# Patient Record
Sex: Female | Born: 1970 | Race: White | Hispanic: No | Smoking: Current every day smoker
Health system: Southern US, Community
[De-identification: ages and names within clinical notes are randomized; demographics above are authoritative.]

## PROBLEM LIST (undated history)

## (undated) DIAGNOSIS — M549 Dorsalgia, unspecified: Secondary | ICD-10-CM

## (undated) DIAGNOSIS — N809 Endometriosis, unspecified: Secondary | ICD-10-CM

## (undated) DIAGNOSIS — G4730 Sleep apnea, unspecified: Secondary | ICD-10-CM

## (undated) DIAGNOSIS — F4310 Post-traumatic stress disorder, unspecified: Secondary | ICD-10-CM

## (undated) DIAGNOSIS — F411 Generalized anxiety disorder: Secondary | ICD-10-CM

## (undated) DIAGNOSIS — C801 Malignant (primary) neoplasm, unspecified: Secondary | ICD-10-CM

## (undated) DIAGNOSIS — K5792 Diverticulitis of intestine, part unspecified, without perforation or abscess without bleeding: Secondary | ICD-10-CM

## (undated) DIAGNOSIS — K589 Irritable bowel syndrome without diarrhea: Secondary | ICD-10-CM

## (undated) HISTORY — DX: Irritable bowel syndrome, unspecified: K58.9

## (undated) HISTORY — PX: ABDOMINAL HYSTERECTOMY: SHX81

## (undated) HISTORY — DX: Diverticulitis of intestine, part unspecified, without perforation or abscess without bleeding: K57.92

## (undated) HISTORY — PX: WISDOM TOOTH EXTRACTION: SHX21

## (undated) HISTORY — PX: OVARIAN CYST REMOVAL: SHX89

## (undated) HISTORY — PX: BREAST LUMPECTOMY: SHX2

## (undated) HISTORY — DX: Generalized anxiety disorder: F41.1

## (undated) HISTORY — DX: Endometriosis, unspecified: N80.9

---

## 2002-05-16 ENCOUNTER — Inpatient Hospital Stay (HOSPITAL_COMMUNITY): Admission: AD | Admit: 2002-05-16 | Discharge: 2002-05-16 | Payer: Self-pay | Admitting: Gynecology

## 2002-05-17 ENCOUNTER — Encounter: Payer: Self-pay | Admitting: Gynecology

## 2002-05-17 ENCOUNTER — Inpatient Hospital Stay (HOSPITAL_COMMUNITY): Admission: AD | Admit: 2002-05-17 | Discharge: 2002-05-17 | Payer: Self-pay | Admitting: Gynecology

## 2002-05-18 ENCOUNTER — Inpatient Hospital Stay (HOSPITAL_COMMUNITY): Admission: AD | Admit: 2002-05-18 | Discharge: 2002-05-28 | Payer: Self-pay | Admitting: *Deleted

## 2002-05-18 ENCOUNTER — Encounter: Payer: Self-pay | Admitting: *Deleted

## 2002-05-19 ENCOUNTER — Encounter: Payer: Self-pay | Admitting: Gynecology

## 2002-05-24 ENCOUNTER — Encounter: Payer: Self-pay | Admitting: Gynecology

## 2002-05-28 ENCOUNTER — Encounter: Payer: Self-pay | Admitting: *Deleted

## 2002-05-28 ENCOUNTER — Encounter (INDEPENDENT_AMBULATORY_CARE_PROVIDER_SITE_OTHER): Payer: Self-pay | Admitting: Specialist

## 2004-08-07 ENCOUNTER — Ambulatory Visit: Payer: Self-pay | Admitting: Internal Medicine

## 2004-08-07 ENCOUNTER — Ambulatory Visit (HOSPITAL_BASED_OUTPATIENT_CLINIC_OR_DEPARTMENT_OTHER): Admission: RE | Admit: 2004-08-07 | Discharge: 2004-08-07 | Payer: Self-pay | Admitting: Family Medicine

## 2004-10-04 ENCOUNTER — Other Ambulatory Visit: Admission: RE | Admit: 2004-10-04 | Discharge: 2004-10-04 | Payer: Self-pay | Admitting: Obstetrics and Gynecology

## 2004-11-23 ENCOUNTER — Emergency Department (HOSPITAL_COMMUNITY): Admission: EM | Admit: 2004-11-23 | Discharge: 2004-11-24 | Payer: Self-pay | Admitting: Emergency Medicine

## 2004-12-05 ENCOUNTER — Emergency Department (HOSPITAL_COMMUNITY): Admission: EM | Admit: 2004-12-05 | Discharge: 2004-12-05 | Payer: Self-pay | Admitting: Emergency Medicine

## 2005-01-18 ENCOUNTER — Encounter: Admission: RE | Admit: 2005-01-18 | Discharge: 2005-01-18 | Payer: Self-pay | Admitting: Neurology

## 2005-07-09 ENCOUNTER — Other Ambulatory Visit: Admission: RE | Admit: 2005-07-09 | Discharge: 2005-07-09 | Payer: Self-pay | Admitting: Obstetrics and Gynecology

## 2007-07-31 DIAGNOSIS — N809 Endometriosis, unspecified: Secondary | ICD-10-CM

## 2007-07-31 HISTORY — DX: Endometriosis, unspecified: N80.9

## 2007-12-08 ENCOUNTER — Ambulatory Visit (HOSPITAL_COMMUNITY): Admission: RE | Admit: 2007-12-08 | Discharge: 2007-12-08 | Payer: Self-pay | Admitting: Family Medicine

## 2008-03-10 ENCOUNTER — Ambulatory Visit (HOSPITAL_COMMUNITY): Admission: RE | Admit: 2008-03-10 | Discharge: 2008-03-10 | Payer: Self-pay | Admitting: Family Medicine

## 2008-07-06 ENCOUNTER — Ambulatory Visit (HOSPITAL_COMMUNITY): Admission: RE | Admit: 2008-07-06 | Discharge: 2008-07-06 | Payer: Self-pay | Admitting: Family Medicine

## 2008-07-16 ENCOUNTER — Ambulatory Visit (HOSPITAL_COMMUNITY): Admission: RE | Admit: 2008-07-16 | Discharge: 2008-07-16 | Payer: Self-pay | Admitting: Family Medicine

## 2008-11-29 ENCOUNTER — Ambulatory Visit (HOSPITAL_COMMUNITY): Admission: RE | Admit: 2008-11-29 | Discharge: 2008-11-29 | Payer: Self-pay | Admitting: Family Medicine

## 2009-06-30 ENCOUNTER — Ambulatory Visit (HOSPITAL_COMMUNITY): Payer: Self-pay | Admitting: Psychiatry

## 2009-07-12 ENCOUNTER — Ambulatory Visit (HOSPITAL_COMMUNITY): Payer: Self-pay | Admitting: Psychiatry

## 2010-02-06 ENCOUNTER — Ambulatory Visit: Payer: Self-pay | Admitting: Oncology

## 2010-02-08 ENCOUNTER — Encounter: Admission: RE | Admit: 2010-02-08 | Discharge: 2010-02-08 | Payer: Self-pay | Admitting: Radiology

## 2010-02-08 LAB — COMPREHENSIVE METABOLIC PANEL
ALT: 19 U/L (ref 0–35)
Albumin: 4.1 g/dL (ref 3.5–5.2)
CO2: 29 mEq/L (ref 19–32)
Calcium: 9.2 mg/dL (ref 8.4–10.5)
Chloride: 105 mEq/L (ref 96–112)
Potassium: 3.7 mEq/L (ref 3.5–5.3)
Sodium: 139 mEq/L (ref 135–145)
Total Protein: 7.4 g/dL (ref 6.0–8.3)

## 2010-02-08 LAB — CBC WITH DIFFERENTIAL/PLATELET
HCT: 40.1 % (ref 34.8–46.6)
HGB: 13.8 g/dL (ref 11.6–15.9)
LYMPH%: 27.5 % (ref 14.0–49.7)
MCHC: 34.4 g/dL (ref 31.5–36.0)
MCV: 94.1 fL (ref 79.5–101.0)
MONO#: 0.6 10*3/uL (ref 0.1–0.9)
MONO%: 6.3 % (ref 0.0–14.0)
NEUT#: 6.5 10*3/uL (ref 1.5–6.5)
NEUT%: 64.1 % (ref 38.4–76.8)
Platelets: 240 10*3/uL (ref 145–400)

## 2010-02-08 LAB — CANCER ANTIGEN 27.29: CA 27.29: 17 U/mL (ref 0–39)

## 2010-02-15 ENCOUNTER — Ambulatory Visit (HOSPITAL_COMMUNITY): Admission: RE | Admit: 2010-02-15 | Discharge: 2010-02-15 | Payer: Self-pay | Admitting: Oncology

## 2010-02-15 ENCOUNTER — Encounter: Payer: Self-pay | Admitting: Oncology

## 2010-02-15 ENCOUNTER — Ambulatory Visit: Admission: RE | Admit: 2010-02-15 | Discharge: 2010-02-15 | Payer: Self-pay | Admitting: Oncology

## 2010-02-15 ENCOUNTER — Ambulatory Visit: Payer: Self-pay | Admitting: Cardiology

## 2010-02-21 ENCOUNTER — Encounter: Admission: RE | Admit: 2010-02-21 | Discharge: 2010-02-21 | Payer: Self-pay | Admitting: General Surgery

## 2010-02-22 ENCOUNTER — Ambulatory Visit: Payer: Self-pay | Admitting: Genetic Counselor

## 2010-04-25 ENCOUNTER — Ambulatory Visit: Payer: Self-pay | Admitting: Genetic Counselor

## 2010-06-09 ENCOUNTER — Ambulatory Visit: Payer: Self-pay | Admitting: Cardiology

## 2010-06-11 ENCOUNTER — Ambulatory Visit: Payer: Self-pay | Admitting: Cardiology

## 2010-07-13 ENCOUNTER — Ambulatory Visit: Payer: Self-pay | Admitting: Physician Assistant

## 2010-08-20 ENCOUNTER — Encounter: Payer: Self-pay | Admitting: Neurology

## 2010-08-20 ENCOUNTER — Encounter: Payer: Self-pay | Admitting: Obstetrics and Gynecology

## 2010-08-20 ENCOUNTER — Encounter: Payer: Self-pay | Admitting: General Surgery

## 2010-12-15 NOTE — Procedures (Signed)
NAMEJONITA, HIROTA                 ACCOUNT NO.:  [id number]   MEDICAL RECORD NO.:  [id number]          PATIENT TYPE:  OUT   LOCATION:  RAD                           FACILITY:  [facility]   PHYSICIAN:  Edward L. Juanetta Gosling, M.D.DATE OF BIRTH:  1970/11/28   DATE OF PROCEDURE:  DATE OF DISCHARGE:  12/08/2007                            PULMONARY FUNCTION TEST   1. Spirometry is normal.  2. Lung volumes are normal.  3. There is some mild airflow obstruction seen at the level of the      smaller airways consistent with smoking history.  There is no      significant bronchodilator response.      Edward L. Juanetta Gosling, M.D.  Electronically Signed     ELH/MEDQ  D:  12/10/2007  T:  12/10/2007  Job:  [id number]   cc:   Donna Bernard, M.D.  Fax: [phone]

## 2010-12-15 NOTE — Discharge Summary (Signed)
NAME:  Lori Underwood, Lori Underwood                           ACCOUNT NO.:  [id number]   MEDICAL RECORD NO.:  [id number]                   PATIENT TYPE:  INP   LOCATION:  9324                                 FACILITY:  [facility]   PHYSICIAN:  Ivor Costa. Farrel Gobble, M.D.              DATE OF BIRTH:  07/01/1971   DATE OF ADMISSION:  05/18/2002  DATE OF DISCHARGE:  05/28/2002                                 DISCHARGE SUMMARY   DISCHARGE DIAGNOSES:  1. Intrauterine pregnancy 17+ weeks.  2. Premature rupture of membranes.  3. Status post induction for preterm premature rupture of membranes at     approximately 18 weeks.  4. Anemia.   HISTORY OF PRESENT ILLNESS:  The patient is a 40 year old female gravida 2,  para 1.  Prenatal course has been complicated by having vaginal infection.  She was treated with Flagyl twice a day for 10 days.  She had premature  rupture of membranes with decreased amniotic fluid prior to this admission  and was released from the hospital in the p.m. of May 17, 2002.   HOSPITAL COURSE:  On May 18, 2002, the patient represented after being  released from the hospital with conservative management __________ 500 mg  b.i.d., with a large gush of fluid.  Repeat ultrasound revealed AFI to be  2.9, pocket less than 5%, cervix 3.5.  Speculum examination revealed no  pooling, moist vaginal mucosa, leaked amniotic fluid upon Valsalva maneuver.  The patient was admitted for IV hydration and Unasyn 3.0 g IV q.6h. and  follow-up ultrasound and CBC.  The patient was continued on conservative  measures, was thoroughly counseled and aware of fetal deformation syndrome.  On May 19, 2002, amniotic fluid continued to be decreased, leaking still  small amount of amniotic fluid, continued IV antibiotics. On May 20, 2002, there was noted to be no noticed fluid on ultrasound.  The patient has  been instructed to wait to follow-up ultrasound schedule.  If continued to  decrease AFI, proceed  with induction.  On May 21, 2002, the patient  continued status quo as well as on May 22, 2002.  On May 23, 2002,  the patient was asymptomatic, afebrile.  An ultrasound was performed on  May 24, 2002, which showed AFI of 0.68, essentially no change.  The  patient and her husband were counseled and desired induction of labor.  Cytotec was placed.  On May 25, 2002, increased 100 mg intravaginal  q.6h.  Epidural was on board.  Foley catheter was on board.  Laminaria were  replaced in the p.m. of May 25, 2002, secondary to no cervical change.  It was removed on the a.m. of May 26, 2002.  Cervix at that point was 2  cm, 80% effaced, bloody show.  Cytotec tablet was placed on the cervix.  It  was noted that there was some cramping.  Cervix exam at 2200  revealed cervix  was 2 cm, large clot which was removed, Cytotec placed.  May 27, 2002,  the patient was continued on Cytotec, complained of increased cramps at 1300  hours.  Blood clots and fluid was in the introitus.  Cervical check was  still 2 cm.  On May 27, 2002, the patient felt significantly more  cramping and subsequently the placenta was noted in the vagina.  Uterus  massaged and placenta and fetus expelled intact.  There were no  complications.  The patient had hemoglobin on May 28, 2002, of 6.6.  It  was felt that this was probably secondary to an abruption since there had  been copious clotting prior to delivery.  The patient was asymptomatic and  she was felt stable for discharge.  She was to follow up in the office in  two weeks.  She was given prescription for Xanax p.r.n. She was to resume  her Wellbutrin one p.o. daily and then increase to b.i.d. after one week.    Susa Loffler, P.A.                    Ivor Costa. Farrel Gobble, M.D.     TSG/MEDQ  D:  07/31/2002  T:  07/31/2002  Job:  [id number]

## 2010-12-15 NOTE — Consult Note (Signed)
   NAME:  Lori Underwood, Lori Underwood                           ACCOUNT NO.:  [id number]   MEDICAL RECORD NO.:  [id number]                   PATIENT TYPE:  INP   LOCATION:  9181                                 FACILITY:  [facility]   PHYSICIAN:  Ivor Costa. Farrel Gobble, M.D.              DATE OF BIRTH:  04/07/1971   DATE OF CONSULTATION:  DATE OF DISCHARGE:                                   CONSULTATION   ADDENDUM:  We also discussed the risk of induction of labor being that she  has a low transverse uterine scar and the use of Cytotec while it has been  associated with increased risk for scar rupture it is generally seen in  third trimester, although the patient is aware of this increased risk of  scar rupture and the complications that that would entail.                                               Ivor Costa. Farrel Gobble, M.D.    THL/MEDQ  D:  05/24/2002  T:  05/25/2002  Job:  [id number]

## 2010-12-15 NOTE — Consult Note (Signed)
NAME:  Lori Underwood, Lori Underwood                           ACCOUNT NO.:  [id number]   MEDICAL RECORD NO.:  [id number]                   PATIENT TYPE:  INP   LOCATION:  9134                                 FACILITY:  [facility]   PHYSICIAN:  Timothy P. Fontaine, M.D.           DATE OF BIRTH:  September 23, 1970   DATE OF CONSULTATION:  05/19/2002  DATE OF DISCHARGE:                                   CONSULTATION   HISTORY OF PRESENT ILLNESS:  In to see the patient and her husband.  Results  of ultrasound from today show essentially a 0 AFI.  They had a 5.5 cm AFI  the day prior.  Reviewed situation with the patient and her husband as has  Dr. Lily Peer and Dr. Penni Homans previously.  I reviewed with them the  various issues to include the maternal versus the fetal issues.  I have  discussed risks of expectant management as far as the mother is concerned to  include the risks of infection and sepsis.  I reviewed the fetal issues as  far as short term and long term.  I reviewed the issues of pulmonary  hypoplasia as well as structural deformities associated with prolonged  oligohydramnios.  I also discussed the issues of achieving a periviable  gestation such as 23-24 weeks, at which time subsequent birth would have the  potential for long term devastating sequelae, both neurologic as well as  physical.  The options for termination now as well as continued expectant  management were reviewed, and the options of termination to include D&E  versus induction were discussed with them.  I also discussed with her her  prior history of cesarean section and the methods of induction to include  Cervidil versus Cytotec type induction and the potential for complications  associated with delivery, both from the D&E to include perforation, internal  organ damage versus induction with uterine rupture, particularly associated  with Cytotec, although I reviewed with her that at this early of gestation  that this more than likely  will not be an issue, but she would accept the  risks if indeed we proceed with this route.  The patient has no gross  history of any leakage over today, although her AFI is lower than yesterday.  She does have a clear history of leakage over this past weekend consistent  with rupture of membranes leading to her diagnosis of oligohydramnios.  I  discussed other possibilities to include nonrupture oligohydramnios and the  ominous prognosis associated with this to include major chromosomal  abnormalities, although at this point everything points towards a rupture of  membranes as the etiology of her oligohydramnios.  She has no overt history  as to etiologies such as falls, injuries, strenuous activities.  The patient  and her husband are going to discuss tonight their desires.  I did ask them  that we need to set a definitive time frame as  far as to proceed with  termination if they ultimately decide if that is appropriate and as to when  we should reultrasound for amniotic fluid accumulation.  I did ask them not  to postpone their decision long term such as 20 weeks, but over the next  several days to a week.  It would be more prudent to make the decision and  proceed with delivery if indeed oligohydramnios continues.  Again  alternatives for continued expectant management were discussed, although she  certainly will have to accept the potential risks to include both maternal  and fetal as discussed with her.  The patient and her husband are going to  further discuss this evening and will follow up review again in the a.m.                                                  Timothy P. Audie Box, M.D.    TPF/MEDQ  D:  05/19/2002  T:  05/19/2002  Job:  [id number]

## 2010-12-15 NOTE — Consult Note (Signed)
NAME:  Lori Underwood, Lori Underwood                           ACCOUNT NO.:  [id number]   MEDICAL RECORD NO.:  [id number]                   PATIENT TYPE:  INP   LOCATION:  9181                                 FACILITY:  [facility]   PHYSICIAN:  Ivor Costa. Farrel Gobble, M.D.              DATE OF BIRTH:  07-25-1971   DATE OF CONSULTATION:  05/24/2002  DATE OF DISCHARGE:                                   CONSULTATION   REASON FOR CONSULTATION:  This is a followup visit from earlier today.  Basically, the patient is 17 weeks and 6/7 with PPROM x8 days hospitalized  now for one week.  The patient had a follow-up ultrasound done today to see  if she has reaccumulated any fluid.  Unfortunately, she had never reported  any leakage of fluid until this morning, only a scant amount, and the  ultrasound confirmed as suspected that there was no reaccumulation of  amniotic fluid.  The AFI was 0.68 cm.  I again discussed the findings with  the patient and her husband.  She does report that she is having some mild  cramping and she did have a small amount of pink discharge earlier, but none  currently, and on examination her uterine fundus is slightly tender,  although she remains afebrile.  Certainly, the risks were as outlined  previously including severe malformations of the infant some of which may  not be compatible with life such as hypoplastic lungs, abnormalities of the  bowel and colon, limb abnormalities, facial abnormalities also related to  anhydramnios.  Concerns with regards to the patient are her fundal  tenderness, risks for uterine scarring secondary to an overwhelming  infection, certainly risks of sepsis to the patient.   RECOMMENDATIONS:  After discussion with the patient she does agree to go  ahead and have an induction of labor and we will start that this evening.  Of note, with review of the ultrasound, the patient does have a low anterior  placenta.  She does have a history of a previous cesarean  section.  So in  addition to consenting her to a postpartum D&C for retained placenta, a D&E  for failed successful delivery vaginally in the event that the patient  begins to hemorrhage or become unstable.  Exploratory laparotomy and total  abdominal hysterectomy or adding the likelihood of needing to do either of  the above is low, albeit the patient was consented and agreeable.  Risks  from the prolonged rupture include an Asherman syndrome and a postpartum  endometritis and certainly increase the risk for her not being successful in  vaginally delivering the placenta.  We also had a long discussion with  regard to care of the fetus.  They are agreeable to doing fetal chromosome  studies which we will do after delivery.  In addition, we will do placental  chromosomes for both fetal and maternal sides.  They  are aware that perhaps  they may have no growth secondary to the IV antibiotics that she has been  on.  In addition, they have consented to an autopsy.  Of note, the patient  has not had an MSAFP or screening ultrasound for anomalies done at this  point in the pregnancy.  Risks of bleeding, infection, damage to the  bladder, risks of blood transfusion were discussed.  Risks of sepsis were  discussed.  Risks of infection as a result of transfusion was discussed.  All questions were addressed.  Total length of consultation was 60 minutes.                                               Ivor Costa. Farrel Gobble, M.D.    THL/MEDQ  D:  05/24/2002  T:  05/25/2002  Job:  [id number]

## 2010-12-15 NOTE — Procedures (Signed)
NAMEMASAKO, OVERALL                 ACCOUNT NO.:  [id number]   MEDICAL RECORD NO.:  [id number]          PATIENT TYPE:  OUT   LOCATION:  SLEEP CENTER                 FACILITY:  [facility]   PHYSICIAN:  Clinton D. Maple Hudson, M.D. DATE OF BIRTH:  05/10/1971   DATE OF STUDY:  08/07/2004                              NOCTURNAL POLYSOMNOGRAM   INDICATIONS FOR STUDY:  Hypersomnia with sleep apnea. Epworth sleepiness  score 8/24, BMI 26.5, weight 150 pounds.   SLEEP ARCHITECTURE:  Total sleep time 355 minutes with sleep efficiency of  86%. Stage I was 5%, stage II was 55%, stages III and IV were 11%, REM was  29% of total sleep time. Latency to sleep onset 23.5 minutes. Latency to REM  98 minutes. Awake after sleep onset 35 minutes. Arousal index 30.9, which is  increased.   RESPIRATORY DATA:  RDI 10.6 obstructive events per hour indicating mild  obstructive sleep apnea/hypopnea syndrome. This reflected 63 hypopneas. The  events were not positional. REM RDI 7.6 per hour. There were insufficient  events to trigger use of the CPAP titration protocol on this study.   OXYGEN DATA:  Moderate to loud snoring with oxygen desaturation to a nadir  of 76% noted briefly. Typical desaturation was to 94% and mean oxygen  saturation to the study was 96% to 97% on room air.   CARDIAC DATA:  Normal sinus rhythm.   MOVEMENT/PARASOMNIA:  A total of limb jerks were reported of which 25 were  associated with arousal or awakening for a periodic limb movement with  arousal index of 4.2 per hour, which is mildly increased.   IMPRESSION/RECOMMENDATIONS:  1.  Mild obstructive sleep apnea/hypopnea syndrome with normal oxygenation,      RDI 10.6 per hour. If conservative measures are insufficient a return      for CPAP titration may be appropriate.  2.  Mild periodic limb movement with arousal, 4.2 per hour. This may justify      treatment if it persists.  It is bothersome after treatment for sleep      apnea.                                                      Clinton D. Maple Hudson, M.D.  Diplomate, [employer]   CDY/MEDQ  D:  08/13/2004 09:48:43  T:  08/13/2004 11:07:47  Job:  [id number]

## 2010-12-15 NOTE — Consult Note (Signed)
NAME:  Lori Underwood, Lori Underwood                           ACCOUNT NO.:  [id number]   MEDICAL RECORD NO.:  [id number]                   PATIENT TYPE:  MAT   LOCATION:  MATC                                 FACILITY:  [facility]   PHYSICIAN:  Juan H. Lily Peer, M.D.             DATE OF BIRTH:  1971/03/18   DATE OF CONSULTATION:  DATE OF DISCHARGE:                                   CONSULTATION   HISTORY OF PRESENT ILLNESS:  The patient is a 40 year old gravida 2, para 1,  with a last menstrual period of January 20, 2002.  Estimated date of  confinement of October 28, 2002.  The patient currently is 7-1/[redacted] weeks  gestation and presented to [facility] today complaining of gush of  fluid per vagina, trickling early in the day and then went to the movies,  returned from the movies, and had a big gush at time she went to the  bathroom. She had been seen at [facility] the day before complaining  of a pinkish discharge she had had for a couple of days and a wet prep had  demonstrated large quantities of bacteria and white blood cells.  A  catheterization UA was done and was unremarkable and she was sent home on  MetroGel vaginal cream. Her cervix was long and closed and was afebrile.  Upon her return today, a sterile speculum examination had been performed.  The cervix was long and closed. There was no gross vaginal pooling, but the  vaginal wall was moist, positive nitrazine. A fern test was done and the  slide demonstrated evidence of ferning.  An ultrasound was also performed to  quantitate the amniotic fluid volume and the ultrasound was reported to be  normal for [redacted] weeks gestation and the ultrasound consisted with her dates.  She also had a wet prep that demonstrated today some yeast and many WBCs.  White blood cell count was 11.5, hemoglobin and hematocrit were 11.5 and  34.1 respectively with a platelet count of 254,000. Her abdomen was soft and  nontender, there was no CVA tenderness and no  digital pelvic examination was  done.   PHYSICAL EXAMINATION:  VITAL SIGNS:  Temperature initially was 100.3 and on  repeat was 98.7, blood pressure was 131/68.  GENERAL:  The patient was in no acute distress.   We went through a lengthy discussion of preterm premature rupture of  membranes to include the following.  We discussed the fetal deformation  syndrome from preterm rupture of membranes could result in acute and growth  restriction.  1) Intrauterine growth retardation.  2) Compression  malformation of face, limbs, and extremities.  3) Pulmonary hyperplasia.  We  had done GC and Chlamydia culture and GBS culture which all are pending at  this time. We discussed potential causes for her premature rupture of  membranes and one was the possibility of underlying infection, urinary  leakage but this was discarded due to the fact that positive nitrazine and  ferning were evident, or the possibility of an incompetent cervix.  The  cervix although transabdominal by ultrasound was documented at 3.2 cm. The  patient's previous pregnancy was a result of a cesarean section at term.  We  discussed low probability such as (25 to 40%) that a viable gestational age  would be achieved and that the patient would deliver a surviving infant.  Nevertheless, this is an early gestation and this could potentially be a  small tear or leak and hopefully it is seal and tissue will continue to  produce amniotic fluid to 36 to 37 weeks of gestation, that conservative  management could be undertaken with close follow-up in the office.  The risk  of maternal infection could potentially lead to abruption from conservative  management as well as high rate of fetal neurological morbidity.  The  patient also had opted to proceed with option A which would be expectant and  with instructions given for bed rest, no intercourse, no lifting or  strenuous activity, increasing her fluid intake to 8 to 10 glasses of  fluid  a day, and monitor daily temperatures at home.  She has a scheduled  appointment for this coming Tuesday in the office already and will have her  call Monday so that we can have an ultrasound before so that we can do an  AFI at that point and will also check a CBC again to make sure that there is  no increasing leukocytosis.  The patient is to report to the office or  hospital immediately in the event of elevated temperature, large quantities  of amniotic fluid passage through the vagina, or a lot of abdominal pelvic  pressure.  If it comes to the point that by outpatient treatment, the  patient's ultrasound demonstrates severe oligohydramnios then at that point  it would need to be addressed whether termination may be indicated at that  point, solely elective by the patient because of the potential risks of  those issues outlined above.  The patient will be placed also on Augmentin  500 mg p.o. b.i.d. she was given a prescription for two-week course and will  continue to monitor closely as an outpatient. All of this was explained to  the patient and husband in detail.  All questions were answered and will  follow accordingly.                                               Juan H. Lily Peer, M.D.    JHF/MEDQ  D:  05/17/2002  T:  05/18/2002  Job:  [id number]

## 2011-02-05 ENCOUNTER — Emergency Department (HOSPITAL_COMMUNITY): Payer: BC Managed Care – PPO

## 2011-02-05 ENCOUNTER — Emergency Department (HOSPITAL_COMMUNITY)
Admission: EM | Admit: 2011-02-05 | Discharge: 2011-02-05 | Disposition: A | Discharge status: Home / Self Care | Payer: BC Managed Care – PPO | Attending: Emergency Medicine | Admitting: Emergency Medicine

## 2011-02-05 DIAGNOSIS — R51 Headache: Secondary | ICD-10-CM

## 2011-02-05 DIAGNOSIS — F17200 Nicotine dependence, unspecified, uncomplicated: Secondary | ICD-10-CM | POA: Insufficient documentation

## 2011-02-05 DIAGNOSIS — Z853 Personal history of malignant neoplasm of breast: Secondary | ICD-10-CM | POA: Insufficient documentation

## 2011-02-05 HISTORY — DX: Malignant (primary) neoplasm, unspecified: C80.1

## 2011-02-05 HISTORY — DX: Dorsalgia, unspecified: M54.9

## 2011-02-05 HISTORY — DX: Sleep apnea, unspecified: G47.30

## 2011-02-05 LAB — BASIC METABOLIC PANEL
BUN: 15 mg/dL (ref 6–23)
CO2: 29 mEq/L (ref 19–32)
Chloride: 104 mEq/L (ref 96–112)
Creatinine, Ser: 0.69 mg/dL (ref 0.50–1.10)
Potassium: 4 mEq/L (ref 3.5–5.1)

## 2011-02-05 LAB — URINALYSIS, ROUTINE W REFLEX MICROSCOPIC
Glucose, UA: NEGATIVE mg/dL
Hgb urine dipstick: NEGATIVE
Nitrite: NEGATIVE
Urobilinogen, UA: 0.2 mg/dL (ref 0.0–1.0)

## 2011-02-05 MED ORDER — ONDANSETRON 4 MG PO TBDP
4.0000 mg | ORAL_TABLET | Freq: Once | ORAL | Status: AC
  Administered 2011-02-05: 4 mg via ORAL
  Filled 2011-02-05: qty 1

## 2011-02-05 MED ORDER — HYDROMORPHONE HCL 1 MG/ML IJ SOLN
1.0000 mg | Freq: Once | INTRAMUSCULAR | Status: AC
  Administered 2011-02-05: 1 mg via INTRAVENOUS
  Filled 2011-02-05: qty 1

## 2011-02-05 MED ORDER — DIPHENHYDRAMINE HCL 50 MG/ML IJ SOLN
25.0000 mg | Freq: Once | INTRAMUSCULAR | Status: AC
  Administered 2011-02-05: 50 mg via INTRAVENOUS
  Filled 2011-02-05: qty 1

## 2011-02-05 MED ORDER — KETOROLAC TROMETHAMINE 30 MG/ML IJ SOLN
30.0000 mg | Freq: Once | INTRAMUSCULAR | Status: AC
  Administered 2011-02-05: 30 mg via INTRAVENOUS
  Filled 2011-02-05: qty 1

## 2011-02-05 MED ORDER — SODIUM CHLORIDE 0.9 % IV SOLN
Freq: Once | INTRAVENOUS | Status: AC
  Administered 2011-02-05: 21:00:00 via INTRAVENOUS

## 2011-02-05 NOTE — Progress Notes (Signed)
2311 Patient has received IVF, taken PO fluids. Headache is marginally improved. She does not want any additional medications. She will accept referral to neurology.

## 2011-02-05 NOTE — ED Provider Notes (Addendum)
History     Chief Complaint  Patient presents with  . Headache  . Extremity Weakness   HPI Comments: Patient is s/p breast cancer with radiaton and chemotherapy. Now post recovery on ongoing hormone therapy. Is followed by Dr.Citrin at the [facility] [place] in [place], [place]. PMD is in [facility], Dr. Linton Flemings. States she has had a continuous headache since June. It is thre in the background constantly and gets worse at times. For the last week headache ha been worse.She also has had one recent occasion where she had difficulty findings words. She has notice in the mornngs tht her hands and lower arms are tingling like they have fallen asleep. Today her right arm and hand have felt heavy. She is right handed.  Patient is a 40 y.o. female presenting with headaches and extremity weakness. The history is provided by the patient.  Headache  This is a chronic problem. The current episode started more than 1 week ago. The problem occurs constantly. The problem has not changed since onset.The headache is associated with bright light and estrogen. The pain is located in the temporal region. The quality of the pain is described as throbbing. The pain is at a severity of 7/10. The pain is moderate. The pain does not radiate. Pertinent negatives include no anorexia, no fever, no chest pressure, no near-syncope, no palpitations, no shortness of breath, no nausea and no vomiting. She has tried acetaminophen, aspirin, cold packs and resting in a darkened room for the symptoms. The treatment provided no relief.  Extremity Weakness Associated symptoms include headaches. Pertinent negatives include no abdominal pain and no shortness of breath.    Past Medical History  Diagnosis Date  . Cancer   . Sleep apnea   . Back pain     Past Surgical History  Procedure Date  . Abdominal hysterectomy   . Cesarean section   . Breast lumpectomy     History reviewed. No pertinent family history.  History    Substance Use Topics  . Smoking status: Current Everyday Smoker -- 0.5 packs/day    Types: Cigarettes  . Smokeless tobacco: Not on file  . Alcohol Use: No    OB History    Grav Para Term Preterm Abortions TAB SAB Ect Mult Living                  Review of Systems  Constitutional: Negative for fever.  Respiratory: Negative for shortness of breath.   Cardiovascular: Negative for palpitations and near-syncope.  Gastrointestinal: Negative for nausea, vomiting, abdominal pain and anorexia.  Genitourinary: Negative for difficulty urinating.  Musculoskeletal: Positive for extremity weakness.  Neurological: Positive for headaches.  All other systems reviewed and are negative.    Physical Exam  BP 127/67  Pulse 72  Temp(Src) 98.8 F (37.1 C) (Oral)  Resp 20  Ht 5\' 2"  (1.575 m)  Wt 175 lb (79.379 kg)  BMI 32.01 kg/m2  SpO2 100%  Physical Exam  Nursing note and vitals reviewed. Constitutional: She is oriented to person, place, and time. She appears well-developed and well-nourished.  HENT:  Head: Normocephalic and atraumatic.  Right Ear: External ear normal.  Left Ear: External ear normal.  Nose: Nose normal.  Mouth/Throat: Oropharynx is clear and moist.  Eyes: Conjunctivae and EOM are normal. Pupils are equal, round, and reactive to light.  Neck: Normal range of motion. Neck supple.  Cardiovascular: Normal rate, regular rhythm and normal heart sounds.   Pulmonary/Chest: Effort normal and breath  sounds normal.  Abdominal: Soft. Bowel sounds are normal.  Musculoskeletal: Normal range of motion.  Neurological: She is alert and oriented to person, place, and time. She has normal reflexes. No cranial nerve deficit. Coordination normal.  Skin: Skin is warm and dry.    ED Course  Procedures  MDM      Nicoletta Dress. Colon Branch, MD 02/05/11 1941  Nicoletta Dress. Colon Branch, MD 03/09/11 [id number]  Nicoletta Dress. Colon Branch, MD 03/21/11 [phone]

## 2011-02-05 NOTE — ED Notes (Signed)
Pt resting in bed family at bedside no noted distress no stated needs

## 2011-02-05 NOTE — ED Notes (Signed)
Pt presents with headache for 1 month and right arm and leg numbness/heaviness since 1600 today. Pt also noticed she has been mixing up words. Pt also states she feels like her eyes are crossing when they arent.

## 2011-02-05 NOTE — ED Notes (Signed)
Pt stating persistent rt sided headache, left lower leg cramping, numbness rt arm

## 2011-02-05 NOTE — Progress Notes (Signed)
2027 States headache is marginally better. Has a urine specimen ready for the lab. Awaiting transport to CT.

## 2011-02-05 NOTE — ED Notes (Signed)
Pt slef ambulated out with a steady gait stating no needs

## 2011-09-11 IMAGING — CT NM PET TUM IMG INITIAL (PI) SKULL BASE T - THIGH
1 of 6 series · 1 of 25 positions shown · IV contrast (350 OM)
Comparison: None

CLINICAL DATA: Initial treatment strategy for  breast cancer.

NUCLEAR MEDICINE PET CT INITIAL (PI) SKULL BASE TO THIGH
TECHNIQUE: 14.8 mCi F-18 FDG was injected intravenously via the
right antecubital fossa.  Full-ring PET imaging was performed from
the skull base through the mid-thighs 59  minutes after injection.
CT data was obtained and used for attenuation correction and
anatomic localization only.  (This was not acquired as a diagnostic
CT examination.)
Fasting Blood Glucose:  100

[Series 2: ct images · axial · 3.8mm · 0.98mm/px · 1 of 266 slices shown]
[im 266/266  brain]
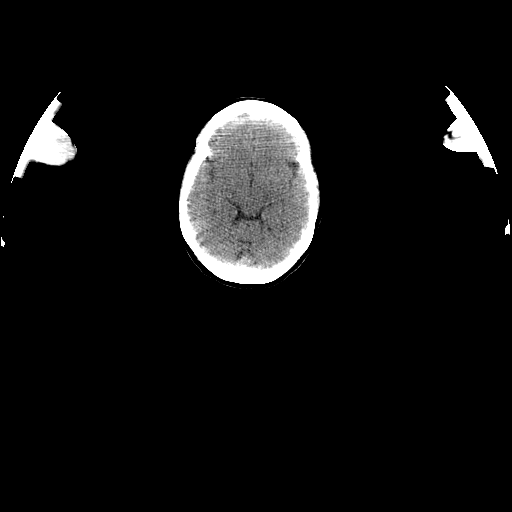

[1 of 25 positions shown; findings below may reference images not displayed]

FINDINGS: There is a focal area of asymmetric uptake within the 12
o'clock position of the left breast consistent with the the
patient's primary breast carcinoma.  The SUV max associated with
this is equal to 2.2.

Mild nonspecific uptake is associated with the prominent left
axillary lymph node.  The SUV max is equal to 1.4, image number 68.

There are no hypermetabolic supraclavicular lymph nodes.

No abnormal FDG uptake within the mediastinum or hilum.

There is no pericardial or pleural effusion.

No hypermetabolic pulmonary nodules or masses. There is no abnormal
uptake within the adrenal glands or pancreas.

The gallbladder appears normal. There are no hypermetabolic pelvic
or inguinal lymph nodes.

The patient has an IUD.

There is a focal area of intense FDG uptake within the left ovary.
The SUV max is equal to 7.1.

There is no evidence for hypermetabolic bone metastasis.
IMPRESSION: 1.  Asymmetric increased uptake within the 12 o'clock position of
the left breast consistent with primary breast carcinoma.
2.  Mild nonspecific uptake within the left axillary lymph node.
This lymph node has been previously biopsied and the uptake may
reflect reactive changes in response to biopsy.

3.  There are no specific features to suggest hypermetabolic
metastasis.
4.  Asymmetric uptake within the left ovary.  In the setting of a
normal appearing ovary this is likely physiologic.  I would
recommend further evaluation with pelvic sonogram to confirm.

## 2014-05-19 ENCOUNTER — Encounter (HOSPITAL_COMMUNITY): Payer: Self-pay | Admitting: Emergency Medicine

## 2014-05-19 ENCOUNTER — Emergency Department (HOSPITAL_COMMUNITY)
Admission: EM | Admit: 2014-05-19 | Discharge: 2014-05-19 | Disposition: A | Discharge status: Home / Self Care | Payer: BC Managed Care – PPO | Attending: Emergency Medicine | Admitting: Emergency Medicine

## 2014-05-19 DIAGNOSIS — R05 Cough: Secondary | ICD-10-CM | POA: Insufficient documentation

## 2014-05-19 DIAGNOSIS — R197 Diarrhea, unspecified: Secondary | ICD-10-CM | POA: Insufficient documentation

## 2014-05-19 DIAGNOSIS — Z720 Tobacco use: Secondary | ICD-10-CM | POA: Diagnosis not present

## 2014-05-19 DIAGNOSIS — J3489 Other specified disorders of nose and nasal sinuses: Secondary | ICD-10-CM | POA: Insufficient documentation

## 2014-05-19 DIAGNOSIS — J0110 Acute frontal sinusitis, unspecified: Secondary | ICD-10-CM | POA: Insufficient documentation

## 2014-05-19 DIAGNOSIS — Z859 Personal history of malignant neoplasm, unspecified: Secondary | ICD-10-CM | POA: Diagnosis not present

## 2014-05-19 DIAGNOSIS — R42 Dizziness and giddiness: Secondary | ICD-10-CM | POA: Insufficient documentation

## 2014-05-19 DIAGNOSIS — R531 Weakness: Secondary | ICD-10-CM | POA: Insufficient documentation

## 2014-05-19 DIAGNOSIS — Z79899 Other long term (current) drug therapy: Secondary | ICD-10-CM | POA: Insufficient documentation

## 2014-05-19 DIAGNOSIS — R5383 Other fatigue: Secondary | ICD-10-CM | POA: Diagnosis present

## 2014-05-19 LAB — CBC WITH DIFFERENTIAL/PLATELET
BASOS PCT: 0 % (ref 0–1)
Basophils Absolute: 0 10*3/uL (ref 0.0–0.1)
EOS ABS: 0.3 10*3/uL (ref 0.0–0.7)
Eosinophils Relative: 4 % (ref 0–5)
HCT: 37.6 % (ref 36.0–46.0)
Hemoglobin: 12.7 g/dL (ref 12.0–15.0)
LYMPHS ABS: 2.8 10*3/uL (ref 0.7–4.0)
Lymphocytes Relative: 35 % (ref 12–46)
MCH: 33.2 pg (ref 26.0–34.0)
MCHC: 33.8 g/dL (ref 30.0–36.0)
MCV: 98.2 fL (ref 78.0–100.0)
Monocytes Absolute: 0.7 10*3/uL (ref 0.1–1.0)
Monocytes Relative: 8 % (ref 3–12)
NEUTROS PCT: 53 % (ref 43–77)
Neutro Abs: 4.3 10*3/uL (ref 1.7–7.7)
PLATELETS: 249 10*3/uL (ref 150–400)
RBC: 3.83 MIL/uL — AB (ref 3.87–5.11)
RDW: 12.9 % (ref 11.5–15.5)
WBC: 8.2 10*3/uL (ref 4.0–10.5)

## 2014-05-19 LAB — BASIC METABOLIC PANEL
ANION GAP: 10 (ref 5–15)
BUN: 12 mg/dL (ref 6–23)
CALCIUM: 9.4 mg/dL (ref 8.4–10.5)
CO2: 27 mEq/L (ref 19–32)
Chloride: 104 mEq/L (ref 96–112)
Creatinine, Ser: 0.65 mg/dL (ref 0.50–1.10)
GFR calc Af Amer: >90 mL/min (ref >90)
Glucose, Bld: 116 mg/dL — ABNORMAL HIGH (ref 70–99)
Potassium: 4.2 mEq/L (ref 3.7–5.3)
SODIUM: 141 meq/L (ref 137–147)

## 2014-05-19 LAB — URINALYSIS, ROUTINE W REFLEX MICROSCOPIC
Bilirubin Urine: NEGATIVE
Glucose, UA: NEGATIVE mg/dL
KETONES UR: NEGATIVE mg/dL
LEUKOCYTES UA: NEGATIVE
NITRITE: NEGATIVE
PROTEIN: NEGATIVE mg/dL
Specific Gravity, Urine: 1.02 (ref 1.005–1.030)
Urobilinogen, UA: 0.2 mg/dL (ref 0.0–1.0)
pH: 6 (ref 5.0–8.0)

## 2014-05-19 LAB — URINE MICROSCOPIC-ADD ON

## 2014-05-19 LAB — I-STAT CG4 LACTIC ACID, ED: LACTIC ACID, VENOUS: 1.01 mmol/L (ref 0.5–2.2)

## 2014-05-19 MED ORDER — AMOXICILLIN 500 MG PO CAPS: 1000.0000 mg | ORAL_CAPSULE | Freq: Two times a day (BID) | ORAL | Status: DC

## 2014-05-19 MED ORDER — SODIUM CHLORIDE 0.9 % IV BOLUS (SEPSIS)
1000.0000 mL | Freq: Once | INTRAVENOUS | Status: AC
  Administered 2014-05-19: 1000 mL via INTRAVENOUS

## 2014-05-19 NOTE — ED Notes (Signed)
Pt complains of not feeling well for several days, her blood pressure has been low and she's had a fever, pt states that she's scared to go to sleep that her blood pressure will drop even lower, she complains of diarrhea, general aches and headaches. Pt's family member states that she seems confused lately

## 2014-05-19 NOTE — ED Provider Notes (Signed)
CSN: [id number]     Arrival date & time 05/19/14  0134 History   First MD Initiated Contact with Patient 05/19/14 [phone]     Chief Complaint  Patient presents with  . Fatigue     (Consider location/radiation/quality/duration/timing/severity/associated sxs/prior Treatment) Patient is a 43 y.o. female presenting with weakness. The history is provided by the patient. No language interpreter was used.  Weakness Associated symptoms include chills, congestion, coughing, fatigue, myalgias and weakness. Pertinent negatives include no abdominal pain, chest pain, fever, nausea, rash or vomiting. Associated symptoms comments: Generalized weakness, body aches and fatigue for the past several weeks, getting worse over time. She also has symptoms of sinus pressure and congestion without fever, and developed a cough later in the course of her illness. No SOB or vomiting. She was seen by her primary care doctor on 04/30/14 and placed on Augmentin but admittedly has taken the medication once daily instead of the prescribed twice daily since she started. She complains of non-bloody diarrhea of 4-6 BMs a day since just after starting the Augmentin. She was also seen by her ENT doctor (05/04/14) and recommendations included a nettie pot (she uses 1/2 the recommended dose packets), decongestants (not comfortable with taking those) and continuation of her Flonase, but reports that burns and does not use. She states last night she felt confused and overly tired, sleeping more than usual. She reports last night she felt near syncopal but did not pass out..    Past Medical History  Diagnosis Date  . Cancer   . Sleep apnea   . Back pain    Past Surgical History  Procedure Laterality Date  . Abdominal hysterectomy    . Cesarean section    . Breast lumpectomy     History reviewed. No pertinent family history. History  Substance Use Topics  . Smoking status: Current Every Day Smoker -- 0.50 packs/day    Types:  Cigarettes  . Smokeless tobacco: Not on file  . Alcohol Use: No   OB History   Grav Para Term Preterm Abortions TAB SAB Ect Mult Living                 Review of Systems  Constitutional: Positive for chills, activity change, appetite change and fatigue. Negative for fever.  HENT: Positive for congestion and sinus pressure.   Eyes: Negative for visual disturbance.  Respiratory: Positive for cough. Negative for shortness of breath.   Cardiovascular: Negative.  Negative for chest pain.  Gastrointestinal: Positive for diarrhea. Negative for nausea, vomiting and abdominal pain.  Genitourinary: Negative for dysuria.  Musculoskeletal: Positive for myalgias.  Skin: Negative.  Negative for rash.  Neurological: Positive for weakness and light-headedness. Negative for syncope.      Allergies  Erythromycin  Home Medications   Prior to Admission medications   Medication Sig Start Date End Date Taking? Authorizing Provider  ALPRAZolam Duanne Moron) 1 MG tablet Take 1 mg by mouth at bedtime as needed.     Yes Historical Provider, MD  Amoxicillin-Pot Clavulanate (AUGMENTIN PO) Take 1 tablet by mouth daily. Patient has resumed taking once daily for on 05/18/2014 and has 7 tablets remaining. 05/18/14  Yes Historical Provider, MD  Cholecalciferol (VITAMIN D) 2000 UNITS CAPS Take 2,000 Units by mouth daily.   Yes Historical Provider, MD  ibuprofen (ADVIL,MOTRIN) 200 MG tablet Take 200 mg by mouth every 6 (six) hours as needed (for pain/headache).   Yes Historical Provider, MD   BP 126/64  Pulse 56  Temp(Src) 98.5  F (36.9 C) (Oral)  Resp 16  SpO2 99% Physical Exam  Constitutional: She is oriented to person, place, and time. She appears well-developed and well-nourished.  HENT:  Head: Normocephalic.  Mouth/Throat: No oropharyngeal exudate.  Eyes: Conjunctivae are normal.  Neck: Normal range of motion. Neck supple.  Cardiovascular: Normal rate and regular rhythm.   No murmur  heard. Pulmonary/Chest: Effort normal and breath sounds normal.  Abdominal: Soft. Bowel sounds are normal. There is no tenderness. There is no rebound and no guarding.  Musculoskeletal: Normal range of motion.  Neurological: She is alert and oriented to person, place, and time.  Skin: Skin is warm and dry. No rash noted.  Psychiatric: She has a normal mood and affect.    ED Course  Procedures (including critical care time) Labs Review Labs Reviewed  CBC WITH DIFFERENTIAL - Abnormal; Notable for the following:    RBC 3.83 (*)    All other components within normal limits  BASIC METABOLIC PANEL - Abnormal; Notable for the following:    Glucose, Bld 116 (*)    All other components within normal limits  URINALYSIS, ROUTINE W REFLEX MICROSCOPIC - Abnormal; Notable for the following:    Hgb urine dipstick TRACE (*)    All other components within normal limits  URINE MICROSCOPIC-ADD ON - Abnormal; Notable for the following:    Crystals CA OXALATE CRYSTALS (*)    All other components within normal limits  I-STAT CG4 LACTIC ACID, ED   Results for orders placed during the hospital encounter of 05/19/14  CBC WITH DIFFERENTIAL      Result Value Ref Range   WBC 8.2  4.0 - 10.5 K/uL   RBC 3.83 (*) 3.87 - 5.11 MIL/uL   Hemoglobin 12.7  12.0 - 15.0 g/dL   HCT 37.6  36.0 - 46.0 %   MCV 98.2  78.0 - 100.0 fL   MCH 33.2  26.0 - 34.0 pg   MCHC 33.8  30.0 - 36.0 g/dL   RDW 12.9  11.5 - 15.5 %   Platelets 249  150 - 400 K/uL   Neutrophils Relative % 53  43 - 77 %   Neutro Abs 4.3  1.7 - 7.7 K/uL   Lymphocytes Relative 35  12 - 46 %   Lymphs Abs 2.8  0.7 - 4.0 K/uL   Monocytes Relative 8  3 - 12 %   Monocytes Absolute 0.7  0.1 - 1.0 K/uL   Eosinophils Relative 4  0 - 5 %   Eosinophils Absolute 0.3  0.0 - 0.7 K/uL   Basophils Relative 0  0 - 1 %   Basophils Absolute 0.0  0.0 - 0.1 K/uL  BASIC METABOLIC PANEL      Result Value Ref Range   Sodium 141  137 - 147 mEq/L   Potassium 4.2  3.7 -  5.3 mEq/L   Chloride 104  96 - 112 mEq/L   CO2 27  19 - 32 mEq/L   Glucose, Bld 116 (*) 70 - 99 mg/dL   BUN 12  6 - 23 mg/dL   Creatinine, Ser 0.65  0.50 - 1.10 mg/dL   Calcium 9.4  8.4 - 10.5 mg/dL   GFR calc non Af Amer >90  >90 mL/min   GFR calc Af Amer >90  >90 mL/min   Anion gap 10  5 - 15  URINALYSIS, ROUTINE W REFLEX MICROSCOPIC      Result Value Ref Range   Color, Urine YELLOW  YELLOW  APPearance CLEAR  CLEAR   Specific Gravity, Urine 1.020  1.005 - 1.030   pH 6.0  5.0 - 8.0   Glucose, UA NEGATIVE  NEGATIVE mg/dL   Hgb urine dipstick TRACE (*) NEGATIVE   Bilirubin Urine NEGATIVE  NEGATIVE   Ketones, ur NEGATIVE  NEGATIVE mg/dL   Protein, ur NEGATIVE  NEGATIVE mg/dL   Urobilinogen, UA 0.2  0.0 - 1.0 mg/dL   Nitrite NEGATIVE  NEGATIVE   Leukocytes, UA NEGATIVE  NEGATIVE  URINE MICROSCOPIC-ADD ON      Result Value Ref Range   Squamous Epithelial / LPF RARE  RARE   Crystals CA OXALATE CRYSTALS (*) NEGATIVE  I-STAT CG4 LACTIC ACID, ED      Result Value Ref Range   Lactic Acid, Venous 1.01  0.5 - 2.2 mmol/L   No results found.  Imaging Review No results found.   EKG Interpretation None      MDM   Final diagnoses:  None    1. Weakness, generalized 2. Sinusitis  Will provide full Rx and instruction on use for partially treated sinus infection. Will switch to Amoxil secondary to diarrhea with Augmentin use. Encourage outpatient follow up with primary care and/or ENT and/or oncology.     Dewaine Oats, PA-C 05/19/14 [phone]

## 2014-05-19 NOTE — Discharge Instructions (Signed)
Sinusitis Sinusitis is redness, soreness, and inflammation of the paranasal sinuses. Paranasal sinuses are air pockets within the bones of your face (beneath the eyes, the middle of the forehead, or above the eyes). In healthy paranasal sinuses, mucus is able to drain out, and air is able to circulate through them by way of your nose. However, when your paranasal sinuses are inflamed, mucus and air can become trapped. This can allow bacteria and other germs to grow and cause infection. Sinusitis can develop quickly and last only a short time (acute) or continue over a long period (chronic). Sinusitis that lasts for more than 12 weeks is considered chronic.  CAUSES  Causes of sinusitis include:  Allergies.  Structural abnormalities, such as displacement of the cartilage that separates your nostrils (deviated septum), which can decrease the air flow through your nose and sinuses and affect sinus drainage.  Functional abnormalities, such as when the small hairs (cilia) that line your sinuses and help remove mucus do not work properly or are not present. SIGNS AND SYMPTOMS  Symptoms of acute and chronic sinusitis are the same. The primary symptoms are pain and pressure around the affected sinuses. Other symptoms include:  Upper toothache.  Earache.  Headache.  Bad breath.  Decreased sense of smell and taste.  A cough, which worsens when you are lying flat.  Fatigue.  Fever.  Thick drainage from your nose, which often is green and may contain pus (purulent).  Swelling and warmth over the affected sinuses. DIAGNOSIS  Your health care provider will perform a physical exam. During the exam, your health care provider may:  Look in your nose for signs of abnormal growths in your nostrils (nasal polyps).  Tap over the affected sinus to check for signs of infection.  View the inside of your sinuses (endoscopy) using an imaging device that has a light attached (endoscope). If your health  care provider suspects that you have chronic sinusitis, one or more of the following tests may be recommended:  Allergy tests.  Nasal culture. A sample of mucus is taken from your nose, sent to a lab, and screened for bacteria.  Nasal cytology. A sample of mucus is taken from your nose and examined by your health care provider to determine if your sinusitis is related to an allergy. TREATMENT  Most cases of acute sinusitis are related to a viral infection and will resolve on their own within 10 days. Sometimes medicines are prescribed to help relieve symptoms (pain medicine, decongestants, nasal steroid sprays, or saline sprays).  However, for sinusitis related to a bacterial infection, your health care provider will prescribe antibiotic medicines. These are medicines that will help kill the bacteria causing the infection.  Rarely, sinusitis is caused by a fungal infection. In theses cases, your health care provider will prescribe antifungal medicine. For some cases of chronic sinusitis, surgery is needed. Generally, these are cases in which sinusitis recurs more than 3 times per year, despite other treatments. HOME CARE INSTRUCTIONS   Drink plenty of water. Water helps thin the mucus so your sinuses can drain more easily.  Use a humidifier.  Inhale steam 3 to 4 times a day (for example, sit in the bathroom with the shower running).  Apply a warm, moist washcloth to your face 3 to 4 times a day, or as directed by your health care provider.  Use saline nasal sprays to help moisten and clean your sinuses.  Take medicines only as directed by your health care provider.    If you were prescribed either an antibiotic or antifungal medicine, finish it all even if you start to feel better. SEEK IMMEDIATE MEDICAL CARE IF:  You have increasing pain or severe headaches.  You have nausea, vomiting, or drowsiness.  You have swelling around your face.  You have vision problems.  You have a stiff  neck.  You have difficulty breathing. MAKE SURE YOU:   Understand these instructions.  Will watch your condition.  Will get help right away if you are not doing well or get worse. Document Released: 07/16/2005 Document Revised: 11/30/2013 Document Reviewed: 07/31/2011 [facility] Patient Information 2015 [place], [place]. This information is not intended to replace advice given to you by your health care provider. Make sure you discuss any questions you have with your health care provider. Weakness Weakness is a lack of strength. It may be felt all over the body (generalized) or in one specific part of the body (focal). Some causes of weakness can be serious. You may need further medical evaluation, especially if you are elderly or you have a history of immunosuppression (such as chemotherapy or HIV), kidney disease, heart disease, or diabetes. CAUSES  Weakness can be caused by many different things, including:  Infection.  Physical exhaustion.  Internal bleeding or other blood loss that results in a lack of red blood cells (anemia).  Dehydration. This cause is more common in elderly people.  Side effects or electrolyte abnormalities from medicines, such as pain medicines or sedatives.  Emotional distress, anxiety, or depression.  Circulation problems, especially severe peripheral arterial disease.  Heart disease, such as rapid atrial fibrillation, bradycardia, or heart failure.  Nervous system disorders, such as Guillain-Barr syndrome, multiple sclerosis, or stroke. DIAGNOSIS  To find the cause of your weakness, your caregiver will take your history and perform a physical exam. Lab tests or X-rays may also be ordered, if needed. TREATMENT  Treatment of weakness depends on the cause of your symptoms and can vary greatly. HOME CARE INSTRUCTIONS   Rest as needed.  Eat a well-balanced diet.  Try to get some exercise every day.  Only take over-the-counter or prescription  medicines as directed by your caregiver. SEEK MEDICAL CARE IF:   Your weakness seems to be getting worse or spreads to other parts of your body.  You develop new aches or pains. SEEK IMMEDIATE MEDICAL CARE IF:   You cannot perform your normal daily activities, such as getting dressed and feeding yourself.  You cannot walk up and down stairs, or you feel exhausted when you do so.  You have shortness of breath or chest pain.  You have difficulty moving parts of your body.  You have weakness in only one area of the body or on only one side of the body.  You have a fever.  You have trouble speaking or swallowing.  You cannot control your bladder or bowel movements.  You have black or bloody vomit or stools. MAKE SURE YOU:  Understand these instructions.  Will watch your condition.  Will get help right away if you are not doing well or get worse. Document Released: 07/16/2005 Document Revised: 01/15/2012 Document Reviewed: 09/14/2011 [facility] Patient Information 2015 [place], [place]. This information is not intended to replace advice given to you by your health care provider. Make sure you discuss any questions you have with your health care provider.

## 2014-05-19 NOTE — ED Notes (Signed)
Provided patient ginger ale with providers permission.

## 2014-05-19 NOTE — ED Notes (Signed)
Pt has multiple sxs tonight including general fatigue,Pt states she just feels like she is dragging all the time and has no energy to do anything, chills, low grade fever, frontal headache, feels like she cannot focus, slightly disoriented, diarrhea x 1 week

## 2014-05-19 NOTE — ED Provider Notes (Signed)
Medical screening examination/treatment/procedure(s) were performed by non-physician practitioner and as supervising physician I was immediately available for consultation/collaboration.   EKG Interpretation None       Kalman Drape, MD 05/19/14 0800

## 2015-03-11 ENCOUNTER — Other Ambulatory Visit: Payer: Self-pay | Admitting: Otolaryngology

## 2015-03-11 DIAGNOSIS — J329 Chronic sinusitis, unspecified: Secondary | ICD-10-CM

## 2015-03-15 ENCOUNTER — Other Ambulatory Visit: Payer: Medicaid Other

## 2015-03-22 ENCOUNTER — Ambulatory Visit
Admission: RE | Admit: 2015-03-22 | Discharge: 2015-03-22 | Disposition: A | Discharge status: Home / Self Care | Payer: Medicaid Other | Source: Ambulatory Visit | Attending: Otolaryngology | Admitting: Otolaryngology

## 2015-03-22 ENCOUNTER — Encounter (INDEPENDENT_AMBULATORY_CARE_PROVIDER_SITE_OTHER): Payer: Self-pay

## 2015-03-22 DIAGNOSIS — J329 Chronic sinusitis, unspecified: Secondary | ICD-10-CM

## 2015-03-23 ENCOUNTER — Other Ambulatory Visit: Payer: Self-pay | Admitting: Otolaryngology

## 2015-03-23 DIAGNOSIS — J349 Unspecified disorder of nose and nasal sinuses: Secondary | ICD-10-CM

## 2015-08-29 HISTORY — PX: MYRINGOTOMY WITH TUBE PLACEMENT: SHX5663

## 2015-09-13 ENCOUNTER — Encounter: Payer: Self-pay | Admitting: Internal Medicine

## 2015-09-13 ENCOUNTER — Ambulatory Visit (INDEPENDENT_AMBULATORY_CARE_PROVIDER_SITE_OTHER): Payer: BLUE CROSS/BLUE SHIELD | Admitting: Internal Medicine

## 2015-09-13 VITALS — BP 126/86 | HR 61 | Ht 63.0 in | Wt 178.0 lb

## 2015-09-13 DIAGNOSIS — R002 Palpitations: Secondary | ICD-10-CM | POA: Diagnosis not present

## 2015-09-13 DIAGNOSIS — R079 Chest pain, unspecified: Secondary | ICD-10-CM

## 2015-09-13 DIAGNOSIS — R0602 Shortness of breath: Secondary | ICD-10-CM

## 2015-09-13 NOTE — Patient Instructions (Addendum)
Your physician has requested that you have an echocardiogram @ [address] - 3rd floor. Echocardiography is a painless test that uses sound waves to create images of your heart. It provides your doctor with information about the size and shape of your heart and how well your heart's chambers and valves are working. This procedure takes approximately one hour. There are no restrictions for this procedure.  Your physician has requested that you have an exercise tolerance test. For further information please visit [email or web address]. Please also follow instruction sheet, as given.  Your physician has recommended that you wear an event monitor for 2 weeks - please arrange appointment for monitor @ [place] [phone] - 3rd floor). Event monitors are medical devices that record the heart's electrical activity. Doctors most often [place] these monitors to diagnose arrhythmias. Arrhythmias are problems with the speed or rhythm of the heartbeat. The monitor is a small, portable device. You can wear one while you do your normal daily activities. This is usually used to diagnose what is causing palpitations/syncope (passing out).  Your physician recommends that you schedule a follow-up appointment after your testing.

## 2015-09-14 ENCOUNTER — Encounter: Payer: Self-pay | Admitting: *Deleted

## 2015-09-14 DIAGNOSIS — R0602 Shortness of breath: Secondary | ICD-10-CM | POA: Insufficient documentation

## 2015-09-14 DIAGNOSIS — R079 Chest pain, unspecified: Secondary | ICD-10-CM | POA: Insufficient documentation

## 2015-09-14 DIAGNOSIS — R002 Palpitations: Secondary | ICD-10-CM | POA: Insufficient documentation

## 2015-09-14 NOTE — Progress Notes (Signed)
OFFICE NOTE  Chief Complaint:  Palpitations, shortness of breath and fatigue  Primary Care Physician: Brock Ra, PA-C  HPI:  Lori Underwood is a pleasant 45 year old female who presents today for evaluation of palpitations, progressive fatigue and chest discomfort. She has a strong family history of coronary disease both in her father and grandfather. She previously had breast cancer and underwent chemotherapy and had serial echocardiograms which showed no development of cardiomyopathy. She works as a Catering manager and recently is been feeling some worsening stress and anxiety and she's had to commute to a number of different locations. She is currently being treated for diverticulitis and is improving. She's developed some palpitations and chest discomfort. She also feels worsening fatigue and get short winded walking up stairs or long distances pulling her luggage. She feels palpitations which feel like a hard heartbeat and sound reminiscent of PVCs. She's had 3 separate events which were significant has had smaller events as well. Her chest discomfort is not always associated with exertion or relieved by rest. It does tend to be in the center of her chest.  PMHx:  Past Medical History  Diagnosis Date  . Cancer ([place])   . Sleep apnea   . Back pain     Past Surgical History  Procedure Laterality Date  . Abdominal hysterectomy    . Cesarean section    . Breast lumpectomy      FAMHx:  History reviewed. No pertinent family history.  SOCHx:   reports that she has been smoking Cigarettes.  She has been smoking about 0.50 packs per day. She has never used smokeless tobacco. She reports that she does not drink alcohol or use illicit drugs.  ALLERGIES:  Allergies  Allergen Reactions  . Erythromycin Nausea And Vomiting    ROS: A comprehensive review of systems was negative except for: Constitutional: positive for fatigue Respiratory: positive for dyspnea on  exertion Cardiovascular: positive for exertional chest pressure/discomfort and palpitations  HOME MEDS: Current Outpatient Prescriptions  Medication Sig Dispense Refill  . ALPRAZolam (XANAX) 1 MG tablet Take 1 mg by mouth at bedtime as needed.      . ciprofloxacin (CIPRO) 500 MG tablet Take 500 mg by mouth 2 (two) times daily.    . metroNIDAZOLE (FLAGYL) 500 MG tablet Take 500 mg by mouth 3 (three) times daily.    . Probiotic Product (PROBIOTIC DAILY PO) Take 1 tablet by mouth daily.     No current facility-administered medications for this visit.    LABS/IMAGING: No results found for this or any previous visit (from the past 48 hour(s)). No results found.  WEIGHTS: Wt Readings from Last 3 Encounters:  09/13/15 178 lb (80.74 kg)  02/05/11 175 lb (79.379 kg)    VITALS: BP 126/86 mmHg  Pulse 61  Ht 5\' 3"  (1.6 m)  Wt 178 lb (80.74 kg)  BMI 31.54 kg/m2  EXAM: General appearance: alert and no distress Neck: no carotid bruit, no JVD and thyroid not enlarged, symmetric, no tenderness/mass/nodules Lungs: clear to auscultation bilaterally Heart: regular rate and rhythm, S1, S2 normal, no murmur, click, rub or gallop Abdomen: soft, non-tender; bowel sounds normal; no masses,  no organomegaly Extremities: extremities normal, atraumatic, no cyanosis or edema Pulses: 2+ and symmetric Skin: Skin color, texture, turgor normal. No rashes or lesions Neurologic: Grossly normal mildly anxious  EKG: Normal sinus rhythm at 61, possible left atrial enlargement  ASSESSMENT: 1. Heart palpitations 2. Progressive shortness of breath and chest pain  PLAN:  1.   Lori Underwood is describing palpitations which sound like PVCs. This seemed to occur fairly regularly but only happened about 3 times in the last month. Would like to arrange for a 2 week event monitor for her. 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 In addition I like for her to have a stress test and an echocardiogram to further  evaluate her chest pain and progressive shortness of breath. She does have a history of breast cancer and underwent chemotherapy. She had echoes serially during that time with no change in LV function. Her last stress test was years ago, however.  Plan to see her back to discuss those results in a few weeks. Thanks again for the kind referral.  Pixie Casino, MD, [facility] Attending Cardiologist [place] 09/14/2015, 7:48 AM

## 2015-09-28 ENCOUNTER — Telehealth (HOSPITAL_COMMUNITY): Payer: Self-pay

## 2015-09-28 NOTE — Telephone Encounter (Signed)
Encounter complete. 

## 2015-09-30 ENCOUNTER — Inpatient Hospital Stay (HOSPITAL_COMMUNITY): Admission: RE | Admit: 2015-09-30 | Payer: BLUE CROSS/BLUE SHIELD | Source: Ambulatory Visit

## 2015-10-07 ENCOUNTER — Telehealth (HOSPITAL_COMMUNITY): Payer: Self-pay

## 2015-10-07 NOTE — Telephone Encounter (Signed)
Encounter complete. 

## 2015-10-12 ENCOUNTER — Ambulatory Visit (HOSPITAL_COMMUNITY)
Admission: RE | Admit: 2015-10-12 | Discharge: 2015-10-12 | Disposition: A | Discharge status: Home / Self Care | Payer: BLUE CROSS/BLUE SHIELD | Source: Ambulatory Visit | Attending: Internal Medicine | Admitting: Internal Medicine

## 2015-10-12 DIAGNOSIS — R0602 Shortness of breath: Secondary | ICD-10-CM | POA: Diagnosis not present

## 2015-10-12 DIAGNOSIS — R002 Palpitations: Secondary | ICD-10-CM | POA: Insufficient documentation

## 2015-10-12 DIAGNOSIS — R079 Chest pain, unspecified: Secondary | ICD-10-CM | POA: Diagnosis not present

## 2015-10-12 LAB — EXERCISE TOLERANCE TEST
CHL CUP MPHR: 175 {beats}/min
CHL CUP RESTING HR STRESS: 55 {beats}/min
CHL CUP STRESS STAGE 1 HR: 58 {beats}/min
CHL CUP STRESS STAGE 1 SBP: 149 mmHg
CHL CUP STRESS STAGE 2 GRADE: 0 %
CHL CUP STRESS STAGE 2 HR: 61 {beats}/min
CHL CUP STRESS STAGE 4 GRADE: 10 %
CHL CUP STRESS STAGE 4 HR: 106 {beats}/min
CHL CUP STRESS STAGE 5 DBP: 98 mmHg
CHL CUP STRESS STAGE 5 GRADE: 12 %
CHL CUP STRESS STAGE 5 HR: 142 {beats}/min
CHL CUP STRESS STAGE 5 SBP: 148 mmHg
CHL CUP STRESS STAGE 5 SPEED: 2.5 mph
CHL CUP STRESS STAGE 6 HR: 166 {beats}/min
CHL CUP STRESS STAGE 6 SBP: 201 mmHg
CHL CUP STRESS STAGE 7 GRADE: 16 %
CHL CUP STRESS STAGE 7 SPEED: 4.2 mph
CHL CUP STRESS STAGE 8 DBP: 99 mmHg
CHL CUP STRESS STAGE 8 HR: 157 {beats}/min
CHL CUP STRESS STAGE 8 SBP: 197 mmHg
CHL CUP STRESS STAGE 9 HR: 87 {beats}/min
CSEPED: 10 min
CSEPHR: 102 %
CSEPPHR: 176 {beats}/min
Estimated workload: 11.7 METS
Percent of predicted max HR: 100 %
RPE: 16
Stage 1 DBP: 76 mmHg
Stage 1 Grade: 0 %
Stage 1 Speed: 0 mph
Stage 2 Speed: 1 mph
Stage 3 Grade: 0 %
Stage 3 HR: 63 {beats}/min
Stage 3 Speed: 1 mph
Stage 4 DBP: 87 mmHg
Stage 4 SBP: 190 mmHg
Stage 4 Speed: 1.7 mph
Stage 6 DBP: 108 mmHg
Stage 6 Grade: 14 %
Stage 6 Speed: 3.4 mph
Stage 7 HR: 176 {beats}/min
Stage 8 Grade: 0 %
Stage 8 Speed: 0 mph
Stage 9 DBP: 90 mmHg
Stage 9 Grade: 0 %
Stage 9 SBP: 130 mmHg
Stage 9 Speed: 0 mph

## 2015-10-13 ENCOUNTER — Telehealth: Payer: Self-pay | Admitting: Internal Medicine

## 2015-10-13 NOTE — Telephone Encounter (Signed)
Spoke to patient.  Stress test Result given . Verbalized understanding  Patient would like to have echo schedule and wait on wearing the monitor  Available march 28 ,2017  Will send information to scheduler. Patient verbalized understanding.

## 2015-10-13 NOTE — Telephone Encounter (Signed)
New message     Calling to get stress test results.  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  Please call today

## 2016-01-20 ENCOUNTER — Encounter: Payer: Self-pay | Admitting: Genetic Counselor

## 2016-04-27 ENCOUNTER — Encounter (HOSPITAL_COMMUNITY): Payer: Self-pay | Admitting: *Deleted

## 2016-04-27 ENCOUNTER — Emergency Department (HOSPITAL_COMMUNITY): Payer: BLUE CROSS/BLUE SHIELD

## 2016-04-27 ENCOUNTER — Emergency Department (HOSPITAL_COMMUNITY)
Admission: EM | Admit: 2016-04-27 | Discharge: 2016-04-27 | Disposition: A | Discharge status: Home / Self Care | Payer: BLUE CROSS/BLUE SHIELD | Attending: Physician Assistant | Admitting: Physician Assistant

## 2016-04-27 DIAGNOSIS — S92302A Fracture of unspecified metatarsal bone(s), left foot, initial encounter for closed fracture: Secondary | ICD-10-CM

## 2016-04-27 DIAGNOSIS — S92325A Nondisplaced fracture of second metatarsal bone, left foot, initial encounter for closed fracture: Secondary | ICD-10-CM | POA: Diagnosis not present

## 2016-04-27 DIAGNOSIS — W19XXXA Unspecified fall, initial encounter: Secondary | ICD-10-CM | POA: Insufficient documentation

## 2016-04-27 DIAGNOSIS — Z859 Personal history of malignant neoplasm, unspecified: Secondary | ICD-10-CM | POA: Insufficient documentation

## 2016-04-27 DIAGNOSIS — F17210 Nicotine dependence, cigarettes, uncomplicated: Secondary | ICD-10-CM | POA: Diagnosis not present

## 2016-04-27 DIAGNOSIS — Y9259 Other trade areas as the place of occurrence of the external cause: Secondary | ICD-10-CM | POA: Diagnosis not present

## 2016-04-27 DIAGNOSIS — S92345A Nondisplaced fracture of fourth metatarsal bone, left foot, initial encounter for closed fracture: Secondary | ICD-10-CM | POA: Diagnosis not present

## 2016-04-27 DIAGNOSIS — S82892A Other fracture of left lower leg, initial encounter for closed fracture: Secondary | ICD-10-CM | POA: Diagnosis present

## 2016-04-27 DIAGNOSIS — S92312A Displaced fracture of first metatarsal bone, left foot, initial encounter for closed fracture: Secondary | ICD-10-CM | POA: Insufficient documentation

## 2016-04-27 DIAGNOSIS — Y999 Unspecified external cause status: Secondary | ICD-10-CM | POA: Diagnosis not present

## 2016-04-27 DIAGNOSIS — Y9301 Activity, walking, marching and hiking: Secondary | ICD-10-CM | POA: Diagnosis not present

## 2016-04-27 DIAGNOSIS — Z79899 Other long term (current) drug therapy: Secondary | ICD-10-CM | POA: Insufficient documentation

## 2016-04-27 MED ORDER — OXYCODONE-ACETAMINOPHEN 5-325 MG PO TABS: 1.0000 | ORAL_TABLET | Freq: Four times a day (QID) | ORAL | 0 refills | Status: DC | PRN

## 2016-04-27 MED ORDER — MORPHINE SULFATE (PF) 4 MG/ML IV SOLN
4.0000 mg | Freq: Once | INTRAVENOUS | Status: AC
Start: 2016-04-27 — End: 2016-04-27
  Administered 2016-04-27: 4 mg via INTRAVENOUS
  Filled 2016-04-27: qty 1

## 2016-04-27 NOTE — ED Provider Notes (Signed)
[facility] DEPT Provider Note   CSN: [id number] Arrival date & time: 04/27/16  1341  By signing my name below, I, Emmanuella Mensah, attest that this documentation has been prepared under the direction and in the presence of [facility], PA-C. Electronically Signed: Judithann Sauger, ED Scribe. 04/27/16. 3:23 PM.   History   Chief Complaint Chief Complaint  Patient presents with  . Left ankle fracture   HPI Comments: Lori Underwood is a 45 y.o. female brought in by ambulance who presents to the Emergency Department complaining of gradually worsening moderate left ankle/foot pain with swelling described as throbbing onset 2 days ago. She reports associated decreased ROM of her left toes and difficulty ambulating secondary to pain. She explains that she was walking with flip flops down to a hotel shuttle when she twisted her left foot, landing hard on the top of her foot while in [place]. She denies any LOC at that time. She states that she had x-rays done at an urgent care in [place] and when she arrived to [place] today, she called [employer] but they informed her that they cannot see her unless she faxed over her imaging results. No alleviating factors noted. Pt received Fetanyl en route by EMS and states that that improved her pain but that it is now gradually returning. She denies any numbness, tingling or open wounds.    The history is provided by the patient. No language interpreter was used.    Past Medical History:  Diagnosis Date  . Back pain   . Cancer ([place])   . Diverticulitis   . Endometriosis 2009  . Generalized anxiety disorder   . IBS (irritable bowel syndrome)    mild  . Sleep apnea     Patient Active Problem List   Diagnosis Date Noted  . Palpitations 09/14/2015  . Pain in the chest 09/14/2015  . Shortness of breath 09/14/2015    Past Surgical History:  Procedure Laterality Date  . ABDOMINAL HYSTERECTOMY  2011, 2012  . BREAST LUMPECTOMY      . CESAREAN SECTION  1996  . OVARIAN CYST REMOVAL    . WISDOM TOOTH EXTRACTION  1980s    OB History    No data available       Home Medications    Prior to Admission medications   Medication Sig Start Date End Date Taking? Authorizing Provider  ALPRAZolam Duanne Moron) 1 MG tablet Take 1 mg by mouth at bedtime as needed.      Historical Provider, MD  ciprofloxacin (CIPRO) 500 MG tablet Take 500 mg by mouth 2 (two) times daily. 09/07/15   Historical Provider, MD  metroNIDAZOLE (FLAGYL) 500 MG tablet Take 500 mg by mouth 3 (three) times daily. 09/07/15   Historical Provider, MD  Probiotic Product (PROBIOTIC DAILY PO) Take 1 tablet by mouth daily.    Historical Provider, MD    Family History Family History  Problem Relation Age of Onset  . Breast cancer Mother   . Anuerysm Father     heart  . Lung cancer Maternal Grandmother   . Heart attack Maternal Grandfather   . Anxiety disorder Brother   . Depression Brother   . Anxiety disorder Son   . Irritable bowel syndrome Son     Social History Social History  Substance Use Topics  . Smoking status: Current Every Day Smoker    Packs/day: 0.50    Types: Cigarettes  . Smokeless tobacco: Never Used  . Alcohol use 1.8 oz/week  3 Standard drinks or equivalent per week     Allergies   Erythromycin   Review of Systems Review of Systems  Musculoskeletal: Positive for arthralgias.  Skin: Negative for rash and wound.  All other systems reviewed and are negative.    Physical Exam Updated Vital Signs BP 124/75 (BP Location: Left Arm)   Pulse 66   Temp 98.4 F (36.9 C) (Oral)   Resp 18   SpO2 98%   Physical Exam  Constitutional: She is oriented to person, place, and time. She appears well-developed and well-nourished. No distress.  HENT:  Head: Normocephalic and atraumatic.  Cardiovascular: Normal rate, regular rhythm and normal heart sounds.   No murmur heard. Pulmonary/Chest: Effort normal and breath sounds normal. No  respiratory distress.  Musculoskeletal:  Dorsum of the left foot swollen and significantly tender. Decreased ROM. No warmth, erythema or open wounds. 2+ radial pulse and sensation intact. Unable to bear weight.   Neurological: She is alert and oriented to person, place, and time.  Skin: Skin is warm and dry.  Psychiatric: She has a normal mood and affect.  Nursing note and vitals reviewed.    ED Treatments / Results  DIAGNOSTIC STUDIES: Oxygen Saturation is 98% on RA, nomal by my interpretation.    COORDINATION OF CARE: 3:21 PM- Pt advised of plan for treatment and pt agrees. Pt informed of her x-ray results. She will receive a CT scan for further evaluation.    Labs (all labs ordered are listed, but only abnormal results are displayed) Labs Reviewed - No data to display  EKG  EKG Interpretation None       Radiology Dg Ankle Complete Left  Result Date: 04/27/2016 CLINICAL DATA:  Pain following fall 2 days prior EXAM: LEFT ANKLE COMPLETE - 3+ VIEW COMPARISON:  None. FINDINGS: Frontal, oblique, and lateral views were obtained. There is no evident fracture or joint effusion. Ankle mortise appears intact. There is no appreciable joint space narrowing. IMPRESSION: No fracture or joint effusion. No apparent arthropathy. Ankle mortise appears intact. Electronically Signed   By: Lowella Grip III M.D.   On: 04/27/2016 14:25   Ct Foot Left Wo Contrast  Result Date: 04/27/2016 CLINICAL DATA:  Left foot and ankle pain beginning 2 days ago since a twisting injury while walking. EXAM: CT OF THE LEFT FOOT WITHOUT CONTRAST TECHNIQUE: Multidetector CT imaging of the left foot was performed according to the standard protocol. Multiplanar CT image reconstructions were also generated. COMPARISON:  Plain films left foot this same day. FINDINGS: Bones/Joint/Cartilage The patient has a nondisplaced fracture through the dorsal margin of the medial cuneiform eccentric toward the lateral side. The  fracture extends to the articular surface. A nondisplaced fracture of the base of the second metatarsal on the medial side is also identified. The medial cuneiform and second metatarsal fractures involve the attachment sites of the Lisfranc ligament. A mild impaction fracture seen in the plantar aspect of the middle and lateral cuneiforms without involvement of the articular surface of the bone. Also seen is a nondisplaced fracture of the proximal fourth metatarsal without involvement of the articular surface. The patient also has a fracture of the dorsal margin of the cuboid. The fracture extends from the mid aspect of the cuboid toward the calcaneocuboid joint with depression of 0.2 cm. No other fracture is identified. Joint alignment is maintained. Ligaments Suboptimally assessed by CT. Muscles and Tendons Unremarkable. Soft tissues Unremarkable. IMPRESSION: Fractures of the medial, middle and lateral cuneiforms, cuboid, first, second  and fourth metatarsals as described above. Medial cuneiform and second metatarsal fracture sites involves the attachment sites of the Lisfranc ligament. No other acute abnormality is identified. Electronically Signed   By: Inge Rise M.D.   On: 04/27/2016 16:55   Dg Foot Complete Left  Result Date: 04/27/2016 CLINICAL DATA:  [place] 2 days ago. Dorsal foot pain. Painful weight-bearing. EXAM: LEFT FOOT - COMPLETE 3+ VIEW COMPARISON:  None. FINDINGS: Findings suspicious for a fracture involving the base of the first metatarsal and or the medial cuneiform. On the oblique film there may also be a small fracture involving the base of the second metatarsal medially. This is in the area of the Lisfranc ligament attachment. Recommend CT for further evaluation and to exclude a Lisfranc injury. No other definite fractures are identified. IMPRESSION: Possible fractures at the bases of the first and second metatarsals. Recommend CT for further evaluation. Electronically Signed   By: Marijo Sanes M.D.   On: 04/27/2016 14:58    Procedures Procedures (including critical care time)  Medications Ordered in ED Medications  morphine 4 MG/ML injection 4 mg (4 mg Intravenous Given 04/27/16 1600)     Initial Impression / Assessment and Plan / ED Course  Pearlie Oyster, PA-C has reviewed the triage vital signs and the nursing notes.  Pertinent labs & imaging results that were available during my care of the patient were reviewed by me and considered in my medical decision making (see chart for details).  Clinical Course   Lori Underwood presents to ED for left foot pain x 2 days. X-rays reviewed which Show possible fractures at the bases of the first and second metatarsals-CT recommended for further evaluation. CT was obtained, showing multiple fractures of the foot and involve the attachment sites of lisfrank ligament. Case was discussed with orthopedics, Dr. Ninfa Linden, who recommends patient be nonweightbearing and placed in a posterior splint. Patient is to follow-up with him in the office next week. Rx for pain medication given. Home care instructions discussed. Patient understands follow up care and all questions answered.   Final Clinical Impressions(s) / ED Diagnoses   Final diagnoses:  None    New Prescriptions New Prescriptions   No medications on file   I personally performed the services described in this documentation, which was scribed in my presence. The recorded information has been reviewed and is accurate.    Ozella Almond Akin Yi, PA-C 04/27/16 1847    Courteney Julio Alm, MD 04/28/16 2050

## 2016-04-27 NOTE — Discharge Instructions (Signed)
Do not put any pressure on her left foot! Use crutches at all times! Call the orthopedic surgeon listed on Monday morning to schedule your follow-up appointment. Take pain medication only as needed for severe pain - This can make you very drowsy - please do not drink alcohol, operate heavy machinery or drive on this medication. Return to ER for new or worsening symptoms, any additional concerns.

## 2016-04-27 NOTE — ED Notes (Signed)
Patient transported to CT 

## 2016-04-27 NOTE — ED Notes (Signed)
Bed: WA27 Expected date:  Expected time:  Means of arrival:  Comments: 45 yo left foot fx

## 2016-04-27 NOTE — ED Triage Notes (Signed)
Per EMS - patient fell off a hotel shuttle in [place] 2 days ago and broke her left ankle (according to [facility] xrays in [place]).  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  Patient received Fentanyl en route with EMS and pain is controlled.  Mild swelling noted to left ankle, +2 dorsalis pedis pulses.  Vitals WNL, 116/81, HR 72, 98% on RA.

## 2016-05-01 ENCOUNTER — Ambulatory Visit (INDEPENDENT_AMBULATORY_CARE_PROVIDER_SITE_OTHER): Payer: BLUE CROSS/BLUE SHIELD | Admitting: Orthopaedic Surgery

## 2016-05-01 DIAGNOSIS — S92242A Displaced fracture of medial cuneiform of left foot, initial encounter for closed fracture: Secondary | ICD-10-CM | POA: Diagnosis not present

## 2016-05-01 DIAGNOSIS — S92325A Nondisplaced fracture of second metatarsal bone, left foot, initial encounter for closed fracture: Secondary | ICD-10-CM

## 2016-05-01 DIAGNOSIS — S92315A Nondisplaced fracture of first metatarsal bone, left foot, initial encounter for closed fracture: Secondary | ICD-10-CM | POA: Diagnosis not present

## 2016-05-01 DIAGNOSIS — S93621A Sprain of tarsometatarsal ligament of right foot, initial encounter: Secondary | ICD-10-CM | POA: Diagnosis not present

## 2016-05-01 DIAGNOSIS — S92345A Nondisplaced fracture of fourth metatarsal bone, left foot, initial encounter for closed fracture: Secondary | ICD-10-CM | POA: Diagnosis not present

## 2016-05-02 ENCOUNTER — Emergency Department (HOSPITAL_COMMUNITY)
Admission: EM | Admit: 2016-05-02 | Discharge: 2016-05-02 | Disposition: A | Discharge status: Home / Self Care | Payer: BLUE CROSS/BLUE SHIELD | Attending: Emergency Medicine | Admitting: Emergency Medicine

## 2016-05-02 ENCOUNTER — Encounter (HOSPITAL_COMMUNITY): Payer: Self-pay | Admitting: Emergency Medicine

## 2016-05-02 DIAGNOSIS — Z853 Personal history of malignant neoplasm of breast: Secondary | ICD-10-CM | POA: Insufficient documentation

## 2016-05-02 DIAGNOSIS — Z79899 Other long term (current) drug therapy: Secondary | ICD-10-CM | POA: Insufficient documentation

## 2016-05-02 DIAGNOSIS — R55 Syncope and collapse: Secondary | ICD-10-CM | POA: Insufficient documentation

## 2016-05-02 DIAGNOSIS — F17210 Nicotine dependence, cigarettes, uncomplicated: Secondary | ICD-10-CM | POA: Insufficient documentation

## 2016-05-02 DIAGNOSIS — M79672 Pain in left foot: Secondary | ICD-10-CM | POA: Insufficient documentation

## 2016-05-02 LAB — URINALYSIS, ROUTINE W REFLEX MICROSCOPIC
Bilirubin Urine: NEGATIVE
Glucose, UA: NEGATIVE mg/dL
KETONES UR: NEGATIVE mg/dL
LEUKOCYTES UA: NEGATIVE
NITRITE: NEGATIVE
PH: 6 (ref 5.0–8.0)
Protein, ur: NEGATIVE mg/dL
SPECIFIC GRAVITY, URINE: 1.021 (ref 1.005–1.030)

## 2016-05-02 LAB — CBC
HEMATOCRIT: 39.5 % (ref 36.0–46.0)
HEMOGLOBIN: 13.4 g/dL (ref 12.0–15.0)
MCH: 33.1 pg (ref 26.0–34.0)
MCHC: 33.9 g/dL (ref 30.0–36.0)
MCV: 97.5 fL (ref 78.0–100.0)
Platelets: 287 10*3/uL (ref 150–400)
RBC: 4.05 MIL/uL (ref 3.87–5.11)
RDW: 12.9 % (ref 11.5–15.5)
WBC: 8.8 10*3/uL (ref 4.0–10.5)

## 2016-05-02 LAB — BASIC METABOLIC PANEL
ANION GAP: 7 (ref 5–15)
BUN: 16 mg/dL (ref 6–20)
CO2: 26 mmol/L (ref 22–32)
Calcium: 9.4 mg/dL (ref 8.9–10.3)
Chloride: 104 mmol/L (ref 101–111)
Creatinine, Ser: 0.69 mg/dL (ref 0.44–1.00)
Glucose, Bld: 92 mg/dL (ref 65–99)
POTASSIUM: 3.6 mmol/L (ref 3.5–5.1)
SODIUM: 137 mmol/L (ref 135–145)

## 2016-05-02 LAB — URINE MICROSCOPIC-ADD ON: WBC UA: NONE SEEN WBC/hpf (ref 0–5)

## 2016-05-02 LAB — CBG MONITORING, ED: Glucose-Capillary: 104 mg/dL — ABNORMAL HIGH (ref 65–99)

## 2016-05-02 NOTE — ED Triage Notes (Signed)
Patient states about 2 weeks ago, she was trying to have a bowel movement and felt like she was "going to faint." Patient fractured her left foot on 9/27 Patient followed up with ortho yesterday & was told she also sprained her right foot. Patient was awakened by a sharp shooting pain in her left foot this morning. Patient took a whole percocet (patient has only been taking 1/2 percocet for her pain). Patient was eating breakfast and had a similar episode that she did 2 weeks ago while she was trying to have a BM.

## 2016-05-02 NOTE — ED Provider Notes (Signed)
[facility] DEPT Provider Note   CSN: [id number] Arrival date & time: 05/02/16  1419     History   Chief Complaint Chief Complaint  Patient presents with  . Foot Pain  . Near Syncope    HPI Lori Underwood is a 45 y.o. female.  HPI   Patient is a 45 year old female with history of breast cancer (stage 1 s/p lumpectomy in 2011, currently no treatment, in remission), chronic back pain, anxiety, endometriosis s/p TAH/BSO, presents emergency Department complaining of near syncopal episode which occurred today at 10:30 AM.  She recently had an injury and broke multiple bones in her left ankle, she was placed in a splint to the orthopedic office yesterday and overnight complained of worsening shooting burning pain in her left leg and foot.  This kept her up most of the night and then at 6 AM she took a Percocet, was able to sleep until about 10 AM when she was able to get up.  She reports getting up using her walker and going to the kitchen table when she felt a flush come over her, she felt lightheaded, had palpitations, was clammy and sweaty. She was also nauseated and felt that she might have bowel movement.  She is to call her neighbor to her home checked to evaluate her as he is a Runner, broadcasting/film/video.  She reports that he took her blood pressure and heart rate several times over 20-30 minutes.  He wrote down that she is diaphoretic and pale, however her coloring gradually returned and her symptoms stopped within 30 minutes.  She did not lose consciousness.  Blood pressures recorded were systolic 97 initially and improving to 120s. She was unable to get an appointment at her orthopedic office or her regular doctor's office so she came to the ER for evaluation. She reports a similar episode approximately 3 weeks ago when she was straining on the toilet.  She did not lose consciousness at that time. She reports no shortness of breath or chest pain with these episodes.  One year ago she had a negative stress test  with cardiology and has not had any other cardiac issues.  She currently denies chest pain, shortness of breath.  Her foot pain has improved since taking Percocet. She did elevate her foot overnight. She denies any pallor of her toes.  Her skin and calf appear normally yesterday when her splint was being changed.     Past Medical History:  Diagnosis Date  . Back pain   . Cancer ([place])   . Diverticulitis   . Endometriosis 2009  . Generalized anxiety disorder   . IBS (irritable bowel syndrome)    mild  . Sleep apnea     Patient Active Problem List   Diagnosis Date Noted  . Palpitations 09/14/2015  . Pain in the chest 09/14/2015  . Shortness of breath 09/14/2015    Past Surgical History:  Procedure Laterality Date  . ABDOMINAL HYSTERECTOMY  2011, 2012  . BREAST LUMPECTOMY    . CESAREAN SECTION  1996  . OVARIAN CYST REMOVAL    . WISDOM TOOTH EXTRACTION  1980s    OB History    No data available       Home Medications    Prior to Admission medications   Medication Sig Start Date End Date Taking? Authorizing Provider  ALPRAZolam Duanne Moron) 1 MG tablet Take 1 mg by mouth at bedtime as needed.      Historical Provider, MD  ciprofloxacin (CIPRO) 500 MG  tablet Take 500 mg by mouth 2 (two) times daily. 09/07/15   Historical Provider, MD  metroNIDAZOLE (FLAGYL) 500 MG tablet Take 500 mg by mouth 3 (three) times daily. 09/07/15   Historical Provider, MD  oxyCODONE-acetaminophen (PERCOCET/ROXICET) 5-325 MG tablet Take 1 tablet by mouth every 6 (six) hours as needed for severe pain. 04/27/16   Ozella Almond Ward, PA-C  Probiotic Product (PROBIOTIC DAILY PO) Take 1 tablet by mouth daily.    Historical Provider, MD    Family History Family History  Problem Relation Age of Onset  . Breast cancer Mother   . Anuerysm Father     heart  . Lung cancer Maternal Grandmother   . Heart attack Maternal Grandfather   . Anxiety disorder Brother   . Depression Brother   . Anxiety disorder Son   .  Irritable bowel syndrome Son     Social History Social History  Substance Use Topics  . Smoking status: Current Every Day Smoker    Packs/day: 0.50    Types: Cigarettes  . Smokeless tobacco: Never Used  . Alcohol use 1.8 oz/week    3 Standard drinks or equivalent per week     Allergies   Erythromycin   Review of Systems Review of Systems  All other systems reviewed and are negative.    Physical Exam Updated Vital Signs BP 115/78   Pulse 86   Resp 18   SpO2 97%   Physical Exam  Constitutional: She is oriented to person, place, and time. She appears well-developed and well-nourished. No distress.  HENT:  Head: Normocephalic and atraumatic.  Nose: Nose normal.  Mouth/Throat: Oropharynx is clear and moist. No oropharyngeal exudate.  Eyes: Conjunctivae and EOM are normal. Pupils are equal, round, and reactive to light. Right eye exhibits no discharge. Left eye exhibits no discharge. No scleral icterus.  Neck: Normal range of motion. Neck supple. No JVD present. No tracheal deviation present. No thyromegaly present.  Cardiovascular: Normal rate, regular rhythm, normal heart sounds and intact distal pulses.  Exam reveals no gallop and no friction rub.   No murmur heard. Symmetrical radial pulses 2+   Pulmonary/Chest: Effort normal and breath sounds normal. No stridor. No respiratory distress. She has no wheezes. She has no rales. She exhibits no tenderness.  Abdominal: Soft. Bowel sounds are normal. She exhibits no distension and no mass. There is no tenderness. There is no rebound and no guarding.  Musculoskeletal:  Right ankle in ASO ankle brace Left ankle in a fiberglass splint Bilateral lower extremities with brisk capillary refill, normal sensation to light touch, no pallor or cyanosis  Lymphadenopathy:    She has no cervical adenopathy.  Neurological: She is alert and oriented to person, place, and time. She exhibits normal muscle tone. Coordination normal.  Skin:  Skin is warm and dry. Capillary refill takes less than 2 seconds. No rash noted. She is not diaphoretic. No erythema. No pallor.  Psychiatric: She has a normal mood and affect. Her behavior is normal. Judgment and thought content normal.  Nursing note and vitals reviewed.    ED Treatments / Results  Labs (all labs ordered are listed, but only abnormal results are displayed) Labs Reviewed  CBG MONITORING, ED - Abnormal; Notable for the following:       Result Value   Glucose-Capillary 104 (*)    All other components within normal limits  BASIC METABOLIC PANEL  CBC  URINALYSIS, ROUTINE W REFLEX MICROSCOPIC (NOT AT [facility])    EKG  EKG Interpretation  Date/Time:  Wednesday May 02 2016 14:41:43 EDT Ventricular Rate:  75 PR Interval:    QRS Duration: 78 QT Interval:  382 QTC Calculation: 427 R Axis:   29 Text Interpretation:  Sinus rhythm Abnormal R-wave progression, early transition No previous ECGs available Confirmed by LITTLE MD, RACHEL [phone]) on 05/02/2016 2:46:14 PM Also confirmed by LITTLE MD, [place] [phone]), editor [place] CT, Leda Gauze [phone])  on 05/02/2016 3:20:40 PM       Radiology No results found.  Procedures Procedures (including critical care time)  Medications Ordered in ED Medications - No data to display   Initial Impression / Assessment and Plan / ED Course  I have reviewed the triage vital signs and the nursing notes.  Pertinent labs & imaging results that were available during my care of the patient were reviewed by me and considered in my medical decision making (see chart for details).  Clinical Course   Pt with near syncopal episode.  Vitals recorded during the 20-30 "episode" were hypotensive initially, but SBP gradually improved.  The patient is diaphoretic, pale, had nausea and felt like she may have a bowel movement she did not have any incontinence of stool, no vomiting, no loss of consciousness.  Her symptoms gradually improved as did her  coloring and blood pressure over the 20th 30 minutes. She came to the ER for evaluation. She went prior episode 3 weeks ago which is very similar, occurred while on the commode.  Will obtain orthostatics, EKG.  Patient is well-appearing with stable vital signs.  Basic labs were obtained in triage, unremarkable.  Patient has no hypotension with orthostatic here but increase in heart rate when going from laying to standing.  This may be partially due to difficulty with standing given her right ankle sprain and left ankle fractures.  No tachycardia or arrhythmia.  Feel this is likely vasovagal episodes.  She has seen cardiology in the past and had a negative stress test.  Discussed with the patient returning to cardiology for Holter monitor testing however feel this may be adequately followed up with her PCP.  She had stage I breast cancer diagnosis and lumpectomy in 2011.  No active treatment. She is not having any thoracic symptoms to warrant chest x-ray or chest CT.  will not obtain d-dimer is likely be positive with recent injuries and immobility.  Discussed strict return precautions if the patient has any respiratory symptoms.       Final Clinical Impressions(s) / ED Diagnoses   Final diagnoses:  Near syncope    New Prescriptions New Prescriptions   No medications on file     Delsa Grana, PA-C 05/12/16 [place], MD 05/12/16 [phone]

## 2016-05-15 ENCOUNTER — Telehealth: Payer: Self-pay | Admitting: Internal Medicine

## 2016-05-15 ENCOUNTER — Ambulatory Visit (INDEPENDENT_AMBULATORY_CARE_PROVIDER_SITE_OTHER): Payer: BLUE CROSS/BLUE SHIELD | Admitting: Orthopaedic Surgery

## 2016-05-15 DIAGNOSIS — S92312D Displaced fracture of first metatarsal bone, left foot, subsequent encounter for fracture with routine healing: Secondary | ICD-10-CM | POA: Diagnosis not present

## 2016-05-15 DIAGNOSIS — S92322D Displaced fracture of second metatarsal bone, left foot, subsequent encounter for fracture with routine healing: Secondary | ICD-10-CM

## 2016-05-15 DIAGNOSIS — S92342D Displaced fracture of fourth metatarsal bone, left foot, subsequent encounter for fracture with routine healing: Secondary | ICD-10-CM

## 2016-05-15 NOTE — Telephone Encounter (Signed)
New message      Approx. 2.5 weeks ago, pt fell and fractured her left foot and sprained her rt foot.  She was instructed to take an aspirin daily while her fracture heal.  On oct 4th, day after the fall, pt went to the ER c/o almost fainting----pale, low bp.  She was diagnosed with vasovagal?? Syndrome.  Pt now says for the last 2 weeks, she has not been able to warm up.  Her body temp feels cold and she drinks hot liquids but still cannot warm up.  Could this be a blood flow problem?  Could the aspirin be making her cold?  Please advise

## 2016-05-15 NOTE — Telephone Encounter (Signed)
Spoke with pt states that she was wondering about her sx listed below and if this was cardiac related. She will ask at her ortho appt today but would like cardiac review. Scheduled appt with Meng 05-21-16, please advise

## 2016-05-16 NOTE — Telephone Encounter (Signed)
Her symptom is not very specific, several things can cause her symptom, one of the more common ones include hypothyroidism, low blood pressure, slow heart rate, infection or anemia. I took a look at her recent ED lab, she does not have anemia. I did not see the thyroid was checked. I would be happy to see her but if she gets to see her PCP I also think that is also very reasonable.

## 2016-05-21 ENCOUNTER — Encounter: Payer: Self-pay | Admitting: Physician Assistant

## 2016-05-21 ENCOUNTER — Ambulatory Visit (INDEPENDENT_AMBULATORY_CARE_PROVIDER_SITE_OTHER): Payer: BLUE CROSS/BLUE SHIELD | Admitting: Physician Assistant

## 2016-05-21 VITALS — BP 112/72 | HR 70 | Ht 63.0 in | Wt 173.4 lb

## 2016-05-21 DIAGNOSIS — R002 Palpitations: Secondary | ICD-10-CM

## 2016-05-21 DIAGNOSIS — Z9989 Dependence on other enabling machines and devices: Secondary | ICD-10-CM | POA: Diagnosis not present

## 2016-05-21 DIAGNOSIS — G4733 Obstructive sleep apnea (adult) (pediatric): Secondary | ICD-10-CM

## 2016-05-21 DIAGNOSIS — R55 Syncope and collapse: Secondary | ICD-10-CM

## 2016-05-21 NOTE — Progress Notes (Signed)
Cardiology Office Note    Date:  05/21/2016   ID:  Lori Underwood, DOB July 06, 1971, MRN [id number]  PCP:  Brock Ra, PA-C  Cardiologist:  Dr. Debara Pickett  Chief Complaint  Patient presents with  . Follow-up    seen for Dr. Debara Pickett, recent presyncope episodes    History of Present Illness:  Lori Underwood is a 45 y.o. female with PMH of OSA Who was seen by Dr. Debara Pickett on 09/13/2015 for evaluation of palpitation, progressive fatigue and chest discomfort. He had significant family history of CAD. She also had a history of breast cancer and underwent chemotherapy and had serial echocardiograms which showed no development of cardiomyopathy. She worked as a Catering manager. Given her chest discomfort and palpitation, was recommended for her to undergo stress testing and obtain an echocardiogram and also obtain of 2 weeks event monitor. She had ETT on 10/12/2015 that showed hypertensive response to exercise, upsloping ST segment depression noted during stress test in 2, 3, aVF, V5 and V6 leads which are not consistent with ischemia. It does not appear that echocardiogram or the two-week event monitor was done.   She was recently evaluated by ED physician twice, she went to the ED on 04/27/2016 with complaint of gradual moderate left ankle/foot pain with swelling after twisting her foot. X-ray showed possible fracture at the base of the first and second metatarsals. CT was obtained showed multiple fracture of the foot involving the attachment side of Lisfranc ligament. The case was discussed with Dr. Ninfa Linden with orthopedic surgery, who recommended keeping the patient nonweightbearing and placed in posterior splint and follow-up with orthopedic surgery as outpatient. She will return to the hospital on 05/02/2016 after a near syncope. Patient was referred back to cardiology for further evaluation.  According to the patient, she has a total of 3 near syncopes recently. The first one occurred on October 4, she said  she was dealing with significant neck pain at the time when she felt a strong flushing sensation and that she was going to pass out. The second time happened when she had significant abdominal pain while sitting on the toilet. The last time was somewhat different from the previous 2 times, occurred at [facility] after she was exhausted from work, she think it was to heat. It was the first 2 episodes of syncope that was more concerning for her. She denies any chest pain. She denies any significant palpitation during the episode. However she continued to have the same tachycardia palpitation she experienced earlier this year. She described times when she ran out of energy, she says she was unable to warm her hands and feet despite the use of warm water and warm drinks. The sensation is similar to peripheral vasoconstriction happens during cold weather.   Past Medical History:  Diagnosis Date  . Back pain   . Cancer ([place])   . Diverticulitis   . Endometriosis 2009  . Generalized anxiety disorder   . IBS (irritable bowel syndrome)    mild  . Sleep apnea     Past Surgical History:  Procedure Laterality Date  . ABDOMINAL HYSTERECTOMY  2011, 2012  . BREAST LUMPECTOMY    . CESAREAN SECTION  1996  . OVARIAN CYST REMOVAL    . WISDOM TOOTH EXTRACTION  1980s    Current Medications: Outpatient Medications Prior to Visit  Medication Sig Dispense Refill  . ALPRAZolam (XANAX) 1 MG tablet Take 1 mg by mouth at bedtime as needed.      Marland Kitchen  oxyCODONE-acetaminophen (PERCOCET/ROXICET) 5-325 MG tablet Take 1 tablet by mouth every 6 (six) hours as needed for severe pain. 15 tablet 0  . Probiotic Product (PROBIOTIC DAILY PO) Take 1 tablet by mouth daily.    . ciprofloxacin (CIPRO) 500 MG tablet Take 500 mg by mouth 2 (two) times daily.    . metroNIDAZOLE (FLAGYL) 500 MG tablet Take 500 mg by mouth 3 (three) times daily.     No facility-administered medications prior to visit.      Allergies:    Erythromycin   Social History   Social History  . Marital status: Divorced    Spouse name: Lori Underwood  . Number of children: 2  . Years of education: 12   Occupational History  . flight attendant  Other    [place] History Main Topics  . Smoking status: Current Every Day Smoker    Packs/day: 0.50    Types: Cigarettes  . Smokeless tobacco: Never Used  . Alcohol use 1.8 oz/week    3 Standard drinks or equivalent per week  . Drug use: No  . Sexual activity: Not Asked   Other Topics Concern  . None   Social History Narrative   epworth sleepiness scale = 3 (09/13/2015)     Family History:  The patient's family history includes Anuerysm in her father; Anxiety disorder in her brother and son; Breast cancer in her mother; Depression in her brother; Heart attack in her maternal grandfather; Irritable bowel syndrome in her son; Lung cancer in her maternal grandmother.   ROS:   Please see the history of present illness.    ROS All other systems reviewed and are negative.   PHYSICAL EXAM:   VS:  BP 112/72   Pulse 70   Ht 5\' 3"  (1.6 m)   Wt 173 lb 6.4 oz (78.7 kg)   BMI 30.72 kg/m    GEN: Well nourished, well developed, in no acute distress  HEENT: normal  Neck: no JVD, carotid bruits, or masses Cardiac: RRR; no murmurs, rubs, or gallops,no edema  Respiratory:  clear to auscultation bilaterally, normal work of breathing GI: soft, nontender, nondistended, + BS MS: no deformity or atrophy  Skin: warm and dry, no rash Neuro:  Alert and Oriented x 3, Strength and sensation are intact Psych: euthymic mood, full affect  Wt Readings from Last 3 Encounters:  05/21/16 173 lb 6.4 oz (78.7 kg)  09/13/15 178 lb (80.7 kg)  02/05/11 175 lb (79.4 kg)      Studies/Labs Reviewed:   EKG:  EKG is ordered today.  The ekg ordered today demonstrates Normal sinus rhythm without significant ST-T wave changes.  Recent Labs: 05/02/2016: BUN 16; Creatinine, Ser 0.69; Hemoglobin  13.4; Platelets 287; Potassium 3.6; Sodium 137   Lipid Panel No results found for: CHOL, TRIG, HDL, CHOLHDL, VLDL, LDLCALC, LDLDIRECT  Additional studies/ records that were reviewed today include:   Echo 02/15/2010 Left ventricle: The cavity size was normal. Wall thickness was normal. The estimated ejection fraction was 65%. Wall motion was normal; there were no regional wall motion abnormalities. Left ventricular diastolic function parameters were normal.   ETT 10/12/2015 Study Highlights    Blood pressure demonstrated a hypertensive response to exercise.  Upsloping ST segment depression ST segment depression was noted during stress in the II, III, aVF, V5 and V6 leads. This is not consistent with ischemia.  Negative, adequate stress test.    Sleep Study 08/13/2004 IMPRESSION/RECOMMENDATIONS:  1.  Mild obstructive sleep apnea/hypopnea  syndrome with normal oxygenation,      RDI 10.6 per hour. If conservative measures are insufficient a return      for CPAP titration may be appropriate.  2.  Mild periodic limb movement with arousal, 4.2 per hour. This may justify      treatment if it persists.  It is bothersome after treatment for sleep      apnea.    ASSESSMENT:    1. Palpitation   2. Syncope, unspecified syncope type   3. OSA on CPAP      PLAN:  In order of problems listed above:  1. Palpitation: I do not think her palpitations related to syncope. It occurs episodically and does not appears to last very long. She has been having palpitation for long time. I will obtain a 2 week event monitor to try to identify the source of palpitation. I will also obtain outpatient echocardiogram as well. She also mentions episodes where her peripheral limps are very cold, I don't know what is causing her peripheral vasal constrictions, she denies any history of Reynalds. It may be related to her recent pain. I will obtain a TSH to rule out possible hypothyroidism.  2. Near  syncope: So far she has had 3 episodes of near-syncope. The first 2 episodes vasovagal in nature, she described the sensation as flushing sensation and significant dizziness and weakness. The first episode occurred while she is having significant neck pain. The second episode occurred when she was sitting on the toilet having abdominal ache. Unfortunately, vasovagal episodes tend to be quite situational and may not occur again once her foot is healed.  3. OSA on CPAP: She says currently, she does not have anybody to manage the CPAP, I have discussed with Dr. Lucy Chris nurse, she will be referred to Dr. Claiborne Billings for management of CPAP machine and potentially consider another sleep study.    Medication Adjustments/Labs and Tests Ordered: Current medicines are reviewed at length with the patient today.  Concerns regarding medicines are outlined above.  Medication changes, Labs and Tests ordered today are listed in the Patient Instructions below. Patient Instructions  Medication Instructions:  Continue current medications.   Labwork: Your physician recommends that you return for lab work in: [place]. PLEASE GET LAB WORK THAT DAY.   Testing/Procedures: Your physician has requested that you have an echocardiogram. Echocardiography is a painless test that uses sound waves to create images of your heart. It provides your doctor with information about the size and shape of your heart and how well your heart's chambers and valves are working. This procedure takes approximately one hour. There are no restrictions for this procedure.  Your physician has recommended that you wear an event monitor. Event monitors are medical devices that record the heart's electrical activity. Doctors most often [place] these monitors to diagnose arrhythmias. Arrhythmias are problems with the speed or rhythm of the heartbeat. The monitor is a small, portable device. You can wear one while you do your normal  daily activities. This is usually used to diagnose what is causing palpitations/syncope (passing out). 2 WEEK EVENT.    Follow-Up: Your physician recommends that you schedule a follow-up appointment in: ABOUT 4 WEEKS WITH DR HILTY FROM THE TIME YOU GET THE EVENT MONITOR.   You have been referred to Dr Shelva Majestic for evaluation of sleep apnea.      If you need a refill on your cardiac medications before your next appointment, please  call your pharmacy.      Hilbert Corrigan, [place]  05/21/2016 5:05 PM    [facility] [place], [place], [place]  [address] Phone: [phone]; Fax: [phone]

## 2016-05-21 NOTE — Patient Instructions (Addendum)
Medication Instructions:  Continue current medications.   Labwork: Your physician recommends that you return for lab work in: [place]. PLEASE GET LAB WORK THAT DAY.   Testing/Procedures: Your physician has requested that you have an echocardiogram. Echocardiography is a painless test that uses sound waves to create images of your heart. It provides your doctor with information about the size and shape of your heart and how well your heart's chambers and valves are working. This procedure takes approximately one hour. There are no restrictions for this procedure.  Your physician has recommended that you wear an event monitor. Event monitors are medical devices that record the heart's electrical activity. Doctors most often [place] these monitors to diagnose arrhythmias. Arrhythmias are problems with the speed or rhythm of the heartbeat. The monitor is a small, portable device. You can wear one while you do your normal daily activities. This is usually used to diagnose what is causing palpitations/syncope (passing out). 2 WEEK EVENT.    Follow-Up: Your physician recommends that you schedule a follow-up appointment in: ABOUT 4 WEEKS WITH DR HILTY FROM THE TIME YOU GET THE EVENT MONITOR.   You have been referred to Dr Shelva Majestic for evaluation of sleep apnea.      If you need a refill on your cardiac medications before your next appointment, please call your pharmacy.

## 2016-05-22 ENCOUNTER — Telehealth: Payer: Self-pay | Admitting: *Deleted

## 2016-05-22 NOTE — Telephone Encounter (Signed)
Patient referral hand given to Theressa Stamps foe CPAP and supplies.

## 2016-05-23 ENCOUNTER — Ambulatory Visit (INDEPENDENT_AMBULATORY_CARE_PROVIDER_SITE_OTHER): Payer: BLUE CROSS/BLUE SHIELD

## 2016-05-23 ENCOUNTER — Other Ambulatory Visit: Payer: BLUE CROSS/BLUE SHIELD | Admitting: *Deleted

## 2016-05-23 DIAGNOSIS — R002 Palpitations: Secondary | ICD-10-CM | POA: Diagnosis not present

## 2016-05-23 DIAGNOSIS — I200 Unstable angina: Secondary | ICD-10-CM

## 2016-05-23 DIAGNOSIS — R0602 Shortness of breath: Secondary | ICD-10-CM

## 2016-05-23 DIAGNOSIS — R079 Chest pain, unspecified: Secondary | ICD-10-CM

## 2016-05-23 LAB — TSH: TSH: 0.98 mIU/L

## 2016-05-23 NOTE — Addendum Note (Signed)
Addended by: Eulis Foster on: 05/23/2016 11:44 AM   Modules accepted: Orders

## 2016-05-24 ENCOUNTER — Telehealth: Payer: Self-pay | Admitting: Physician Assistant

## 2016-05-24 NOTE — Telephone Encounter (Signed)
Spoke with pt, questions regarding TSH answered.

## 2016-05-24 NOTE — Telephone Encounter (Signed)
New Message  Pt call requesting to speak with Rn about lab results. Please call back to discuss

## 2016-06-04 ENCOUNTER — Ambulatory Visit (INDEPENDENT_AMBULATORY_CARE_PROVIDER_SITE_OTHER): Payer: BLUE CROSS/BLUE SHIELD

## 2016-06-04 ENCOUNTER — Encounter (INDEPENDENT_AMBULATORY_CARE_PROVIDER_SITE_OTHER): Payer: Self-pay | Admitting: Orthopaedic Surgery

## 2016-06-04 ENCOUNTER — Ambulatory Visit (INDEPENDENT_AMBULATORY_CARE_PROVIDER_SITE_OTHER): Payer: BLUE CROSS/BLUE SHIELD | Admitting: Orthopaedic Surgery

## 2016-06-04 DIAGNOSIS — S92312D Displaced fracture of first metatarsal bone, left foot, subsequent encounter for fracture with routine healing: Secondary | ICD-10-CM

## 2016-06-04 DIAGNOSIS — S92342D Displaced fracture of fourth metatarsal bone, left foot, subsequent encounter for fracture with routine healing: Secondary | ICD-10-CM | POA: Diagnosis not present

## 2016-06-04 DIAGNOSIS — S92322D Displaced fracture of second metatarsal bone, left foot, subsequent encounter for fracture with routine healing: Secondary | ICD-10-CM | POA: Diagnosis not present

## 2016-06-04 NOTE — Progress Notes (Signed)
Office Visit Note   Patient: Lori Underwood           Date of Birth: 11-17-1970           MRN: [id number] Visit Date: 06/04/2016              Requested by: Brock Ra, PA-C [address] [place], [place] [address] PCP: Brock Ra, PA-C   Assessment & Plan: Visit Diagnoses:  1. Closed displaced fracture of first metatarsal bone of left foot with routine healing, subsequent encounter   2. Closed displaced fracture of second metatarsal bone of left foot with routine healing, subsequent encounter   3. Closed displaced fracture of fourth metatarsal bone of left foot with routine healing, subsequent encounter     Plan:  - begin PT for ankle and left foot, gait training, proprioception, wean CAM - orthotic Rx to [facility] given - f/u 6 weeks, repeat left foot xrays - she may not be able to recertify for her training exercises later this month but she'll let us know  Follow-Up Instructions: Return in about 6 weeks (around 07/16/2016) for recheck left foot.   Orders:  Orders Placed This Encounter  Procedures  . XR Foot Complete Left   No orders of the defined types were placed in this encounter.     Procedures: No procedures performed   Clinical Data: No additional findings.   Subjective: Chief Complaint  Patient presents with  . Right Ankle - Pain, Follow-up  . Left Foot - Follow-up, Fracture, Pain    6 week f/u visit for left midfoot fractures doing much better ambulating with CAM.  Right ankle sprain is improving but still symptomatic.  Wear ASO brace.      Review of Systems   Objective: Vital Signs: There were no vitals taken for this visit.  Physical Exam  Right Ankle Exam   Comments:  No swelling.  Benign exam.   Left Ankle Exam   Comments:  No swelling of foot       Specialty Comments:  No specialty comments available.  Imaging: No results found.   PMFS History: Patient Active Problem List   Diagnosis Date Noted  . Palpitations  09/14/2015  . Pain in the chest 09/14/2015  . Shortness of breath 09/14/2015   Past Medical History:  Diagnosis Date  . Back pain   . Cancer ([place])   . Diverticulitis   . Endometriosis 2009  . Generalized anxiety disorder   . IBS (irritable bowel syndrome)    mild  . Sleep apnea     Family History  Problem Relation Age of Onset  . Breast cancer Mother   . Anuerysm Father     heart  . Lung cancer Maternal Grandmother   . Heart attack Maternal Grandfather   . Anxiety disorder Brother   . Depression Brother   . Anxiety disorder Son   . Irritable bowel syndrome Son     Past Surgical History:  Procedure Laterality Date  . ABDOMINAL HYSTERECTOMY  2011, 2012  . BREAST LUMPECTOMY    . CESAREAN SECTION  1996  . OVARIAN CYST REMOVAL    . WISDOM TOOTH EXTRACTION  1980s   Social History   Occupational History  . flight attendant  Other    [place] History Main Topics  . Smoking status: Current Every Day Smoker    Packs/day: 0.50    Types: Cigarettes  . Smokeless tobacco: Never Used  . Alcohol use 1.8  oz/week    3 Standard drinks or equivalent per week  . Drug use: No  . Sexual activity: Not on file

## 2016-06-06 ENCOUNTER — Ambulatory Visit (HOSPITAL_COMMUNITY): Payer: BLUE CROSS/BLUE SHIELD | Attending: Cardiology

## 2016-06-06 ENCOUNTER — Other Ambulatory Visit: Payer: Self-pay

## 2016-06-06 DIAGNOSIS — Z720 Tobacco use: Secondary | ICD-10-CM | POA: Diagnosis not present

## 2016-06-06 DIAGNOSIS — R002 Palpitations: Secondary | ICD-10-CM | POA: Insufficient documentation

## 2016-06-19 ENCOUNTER — Ambulatory Visit: Payer: BLUE CROSS/BLUE SHIELD | Admitting: Cardiovascular Disease

## 2016-06-19 ENCOUNTER — Encounter: Payer: Self-pay | Admitting: Cardiovascular Disease

## 2016-06-19 ENCOUNTER — Ambulatory Visit (INDEPENDENT_AMBULATORY_CARE_PROVIDER_SITE_OTHER): Payer: BLUE CROSS/BLUE SHIELD | Admitting: Cardiovascular Disease

## 2016-06-19 VITALS — BP 122/78 | HR 68 | Ht 63.0 in | Wt 178.0 lb

## 2016-06-19 DIAGNOSIS — Z720 Tobacco use: Secondary | ICD-10-CM | POA: Diagnosis not present

## 2016-06-19 DIAGNOSIS — R55 Syncope and collapse: Secondary | ICD-10-CM

## 2016-06-19 DIAGNOSIS — R0683 Snoring: Secondary | ICD-10-CM | POA: Diagnosis not present

## 2016-06-19 DIAGNOSIS — S82892S Other fracture of left lower leg, sequela: Secondary | ICD-10-CM

## 2016-06-19 DIAGNOSIS — K5790 Diverticulosis of intestine, part unspecified, without perforation or abscess without bleeding: Secondary | ICD-10-CM

## 2016-06-19 DIAGNOSIS — G4733 Obstructive sleep apnea (adult) (pediatric): Secondary | ICD-10-CM | POA: Diagnosis not present

## 2016-06-19 DIAGNOSIS — Z9989 Dependence on other enabling machines and devices: Secondary | ICD-10-CM

## 2016-06-19 NOTE — Patient Instructions (Signed)
Your physician has recommended that you have a sleep study. This test records several body functions during sleep, including: brain activity, eye movement, oxygen and carbon dioxide blood levels, heart rate and rhythm, breathing rate and rhythm, the flow of air through your mouth and nose, snoring, body muscle movements, and chest and belly movement.  Your physician recommends that you schedule a follow-up appointment in March- April office sleep clinic

## 2016-06-19 NOTE — Progress Notes (Signed)
Cardiology Office Note    Date:  06/19/2016   ID:  Lori Underwood, DOB 03/19/1971, MRN [id number]  PCP:  Lori Ra, PA-C  Cardiologist:  Lori Majestic, MD (sleep): Dr. 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  She has a history of obstructive sleep apnea which was diagnosed initially in 2006.  At that time, a polysomnogram showed an RDI of 10.6 per hour with significant oxygen desaturation briefly at 76%.  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  Her machine has been broken.  As result, she has not been sleeping well.  She recently tripped and fractured her foot in 7 places while getting off a hotel shuttle [place].  She was recently diagnosed with osteopenia.  She would like a reevaluation of her sleep apnea and a new full size machine.  Presently, she admits to snoring.  Her sleep is nonrestorative.  She has frequent awakenings.  She admits to daytime fatigue.  She presents for evaluation.  She has a history of tobacco use and started smoking at age 5.  She currently smoking one half pack per day.  She has a history of breast CA and underwent lumpectomy and chemotherapy.  He also has diverticulitis.  She has a history of vasovagal presyncope which she had undergone evaluation with Dr. Debara Underwood.  Past Medical History:  Diagnosis Date  . Back pain   . Cancer ([place])   . Diverticulitis   . Endometriosis 2009  . Generalized anxiety disorder   . IBS (irritable bowel syndrome)    mild  . Sleep apnea     Past Surgical History:  Procedure Laterality Date  . ABDOMINAL HYSTERECTOMY  2011, 2012  . BREAST LUMPECTOMY    . CESAREAN SECTION  1996  . OVARIAN CYST REMOVAL    . WISDOM TOOTH EXTRACTION  1980s    Current Medications: Outpatient  Medications Prior to Visit  Medication Sig Dispense Refill  . ALPRAZolam (XANAX) 1 MG tablet Take 1 mg by mouth at bedtime as needed.      . Cyanocobalamin (B-12 COMPLIANCE INJECTION IJ) Inject as directed every 30 (thirty) days.    . Cholecalciferol (VITAMIN D) 2000 units CAPS Take 1 capsule by mouth daily.    Marland Kitchen oxyCODONE-acetaminophen (PERCOCET/ROXICET) 5-325 MG tablet Take 1 tablet by mouth every 6 (six) hours as needed for severe pain. (Patient not taking: Reported on 06/04/2016) 15 tablet 0  . Probiotic Product (PROBIOTIC DAILY PO) Take 1 tablet by mouth daily.     No facility-administered medications prior to visit.      Allergies:   Erythromycin   Social History   Social History  . Marital status: Divorced    Spouse name: N/A  . Number of children: 2  . Years of education: 12   Occupational History  . flight attendant  Other    [place] History Main Topics  . Smoking status: Current Every Day Smoker    Packs/day: 0.50    Types: Cigarettes  . Smokeless tobacco: Never Used  . Alcohol use 1.8 oz/week    3 Standard drinks or equivalent per week  . Drug use: No  . Sexual activity: Not Asked   Other Topics Concern  . None  Social History Narrative   epworth sleepiness scale = 3 (09/13/2015)    Socially she is divorced.  She has a 63 year old son.  She was born outside [place], [place] but has lived in [place] for 23 years.  Family History:  The patient's family history includes Anuerysm in her father; Anxiety disorder in her brother and son; Breast cancer in her mother; Depression in her brother; Heart attack in her maternal grandfather; Irritable bowel syndrome in her son; Lung cancer in her maternal grandmother.   ROS General: Negative; No fevers, chills, or night sweats;  HEENT: Negative; No changes in vision or hearing, sinus congestion, difficulty swallowing Pulmonary: Negative; No cough, wheezing, shortness of breath,  hemoptysis Cardiovascular: Positive for recent vasovagal presyncope episodes 3 GI: Positive for history of diverticulitis. GU: Negative; No dysuria, hematuria, or difficulty voiding Musculoskeletal: Negative; no myalgias, joint pain, or weakness Hematologic/Oncology: Negative; no easy bruising, bleeding Endocrine: Negative; no heat/cold intolerance; no diabetes Neuro: Negative; no changes in balance, headaches Skin: Negative; No rashes or skin lesions Psychiatric: Negative; No behavioral problems, depression Sleep: Positive for obstructive sleep apnea diagnosed in 2006.  Positive for snoring, daytime sleepiness, hypersomnolence,  No bruxism, restless legs, hypnogognic hallucinations, no cataplexy Other comprehensive 14 point system review is negative.   PHYSICAL EXAM:   VS:  BP 122/78   Pulse 68   Ht 5' 3"  (1.6 m)   Wt 178 lb (80.7 kg)   BMI 31.53 kg/m     Repeat blood pressure by me 122/70. Wt Readings from Last 3 Encounters:  06/19/16 178 lb (80.7 kg)  05/21/16 173 lb 6.4 oz (78.7 kg)  09/13/15 178 lb (80.7 kg)    General: Alert, oriented, no distress.  Skin: normal turgor, no rashes, warm and dry HEENT: Normocephalic, atraumatic. Pupils equal round and reactive to light; sclera anicteric; extraocular muscles intact; Fundi , Disks flat, no hemorrhages or exudates. Nose without nasal septal hypertrophy Mouth/Parynx benign; Mallinpatti scale 3 Neck: No JVD, no carotid bruits; normal carotid upstroke Lungs: clear to ausculatation and percussion; no wheezing or rales Chest wall: without tenderness to palpitation Heart: PMI not displaced, RRR, s1 s2 normal, 1/6 systolic murmur, no diastolic murmur, no rubs, gallops, thrills, or heaves Abdomen: soft, nontender; no hepatosplenomehaly, BS+; abdominal aorta nontender and not dilated by palpation. Back: no CVA tenderness Pulses 2+ Musculoskeletal: full range of motion, normal strength, no joint deformities Extremities:  Hard boot in  her left lower leg secondary to left ankle fracture; clubbing cyanosis or edema, Homan's sign negative  Neurologic: grossly nonfocal; Cranial nerves grossly wnl Psychologic: Normal mood and affect   Studies/Labs Reviewed:   EKG was not ordered today, but her last ECG from 05/21/2016 was reviewed independently by me and shows normal sinus rhythm at 70 bpm with normal intervals and no ST segment changes.  Recent Labs: BMP Latest Ref Rng & Units 05/02/2016 05/19/2014 02/05/2011  Glucose 65 - 99 mg/dL 92 116(H) 98  BUN 6 - 20 mg/dL 16 12 15   Creatinine 0.44 - 1.00 mg/dL 0.69 0.65 0.69  Sodium 135 - 145 mmol/L 137 141 141  Potassium 3.5 - 5.1 mmol/L 3.6 4.2 4.0  Chloride 101 - 111 mmol/L 104 104 104  CO2 22 - 32 mmol/L 26 27 29   Calcium 8.9 - 10.3 mg/dL 9.4 9.4 9.9     Hepatic Function Latest Ref Rng & Units 02/08/2010  Total Protein 6.0 - 8.3 g/dL 7.4  Albumin 3.5 - 5.2 g/dL 4.1  AST 0 - 37  U/L 19  ALT 0 - 35 U/L 19  Alk Phosphatase 39 - 117 U/L 52  Total Bilirubin 0.3 - 1.2 mg/dL 0.6    CBC Latest Ref Rng & Units 05/02/2016 05/19/2014 02/08/2010  WBC 4.0 - 10.5 K/uL 8.8 8.2 10.2  Hemoglobin 12.0 - 15.0 g/dL 13.4 12.7 13.8  Hematocrit 36.0 - 46.0 % 39.5 37.6 40.1  Platelets 150 - 400 K/uL 287 249 240   Lab Results  Component Value Date   MCV 97.5 05/02/2016   MCV 98.2 05/19/2014   MCV 94.1 02/08/2010   Lab Results  Component Value Date   TSH 0.98 05/23/2016   No results found for: HGBA1C   BNP No results found for: BNP  ProBNP No results found for: PROBNP   Lipid Panel  No results found for: CHOL, TRIG, HDL, CHOLHDL, VLDL, LDLCALC, LDLDIRECT   RADIOLOGY: Xr Foot Complete Left  Result Date: 06/04/2016 Stable midfoot fractures with callus formation    Additional studies/ records that were reviewed today include:  The patient's diagnostic polysomnogram from 08/07/2004.  I reviewed the patient's cardiology office notes from Dr. Debara Underwood    ASSESSMENT:    1.  OSA on CPAP   2. Snoring   3. Closed fracture of left ankle, sequela   4. Tobacco abuse   5. Diverticulosis of intestine without bleeding, unspecified intestinal tract location   6. Vasovagal episode      PLAN:  Ms. Morrisa Aldaba is a 45 year old female who has a history of documented obstructive sleep apnea.  Since January 2006.  At that time, her sleep apnea was mild overall but she did have transient significant oxygen desaturation to a nadir of 76%.  She has been using CPAP therapy since 2006 and initially had a ResMed CPAP unit.  In 2011.  She purchased a transcend portable travel CPAP unit.  She believes that she had not gone correct pressures with this machine, but has been using it until machine has broken.  I reviewed her sleep study with her in detail.  Presently, she is symptomatic with snoring, nonrestorative sleep, and frequent awakenings.  She typically goes to bed between 8:30 and 10:30 at night and wakes up at 6:30.  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  At this point, I have recommended a complete reevaluation of her sleep apnea.  We will schedule her for a split-night study to be done at the [place] [facility] lab, which hopefully can be done before the end of the year.  I will then see her in a sleep clinic in follow-up evaluation.  I discussed the importance of smoking cessation.  She is mildly obese and weight loss was recommended.   Medication Adjustments/Labs and Tests Ordered: Current medicines are reviewed at length with the patient today.  Concerns regarding medicines are outlined above.  Medication changes, Labs and Tests ordered today are listed in the Patient Instructions below. Patient Instructions  Your physician has recommended that you have a sleep study. This test records several body functions during sleep, including: brain activity, eye movement, oxygen and  carbon dioxide blood levels, heart rate and rhythm, breathing rate and rhythm, the flow of air through your mouth and nose, snoring, body muscle movements, and chest and belly movement.  Your physician recommends that you schedule a follow-up appointment in March- April office sleep clinic     Signed, Marcello Moores  Claiborne Billings, MD  06/19/2016 6:55 PM    [facility] [address] [place], [place],   [address] Phone: [phone]

## 2016-06-20 ENCOUNTER — Telehealth: Payer: Self-pay | Admitting: *Deleted

## 2016-06-20 NOTE — Telephone Encounter (Signed)
Faxed CPAP supply orders and referral to [facility].

## 2016-06-27 ENCOUNTER — Telehealth (INDEPENDENT_AMBULATORY_CARE_PROVIDER_SITE_OTHER): Payer: Self-pay

## 2016-06-27 NOTE — Telephone Encounter (Signed)
But from a medical stand point, she is not entirely disabled from doing desk duty.

## 2016-06-27 NOTE — Telephone Encounter (Signed)
She may do desk duty.

## 2016-06-27 NOTE — Telephone Encounter (Signed)
What is patient's work status?  

## 2016-06-27 NOTE — Telephone Encounter (Signed)
Called patient to advise  and she states she would like to be out of work all December bc she is still  Doing PT.

## 2016-06-28 ENCOUNTER — Telehealth (INDEPENDENT_AMBULATORY_CARE_PROVIDER_SITE_OTHER): Payer: Self-pay | Admitting: *Deleted

## 2016-06-28 NOTE — Telephone Encounter (Signed)
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 Please advise on what you would like for me to do

## 2016-06-28 NOTE — Telephone Encounter (Signed)
Lori Underwood advised pt when pt called in the AM

## 2016-06-28 NOTE — Telephone Encounter (Signed)
I read pt note, and told her that should would be out of work all of dec. Per liz. Pt stated this paperwork needs to be finished and faxed today 11/30. CB: [phone]

## 2016-06-28 NOTE — Telephone Encounter (Signed)
Paper work faxed and will be sent to her by mail

## 2016-06-28 NOTE — Telephone Encounter (Signed)
Ok that's fine

## 2016-06-28 NOTE — Telephone Encounter (Signed)
Pt called stating she needs to talk to dr or liz asap. I explained to pt that you both are seeing pts but would pass this note along asap.

## 2016-06-29 ENCOUNTER — Ambulatory Visit (INDEPENDENT_AMBULATORY_CARE_PROVIDER_SITE_OTHER): Payer: BLUE CROSS/BLUE SHIELD | Admitting: Internal Medicine

## 2016-06-29 ENCOUNTER — Telehealth (INDEPENDENT_AMBULATORY_CARE_PROVIDER_SITE_OTHER): Payer: Self-pay | Admitting: Orthopaedic Surgery

## 2016-06-29 ENCOUNTER — Encounter: Payer: Self-pay | Admitting: Internal Medicine

## 2016-06-29 VITALS — BP 113/70 | HR 59 | Ht 63.0 in | Wt 180.0 lb

## 2016-06-29 DIAGNOSIS — R002 Palpitations: Secondary | ICD-10-CM | POA: Diagnosis not present

## 2016-06-29 DIAGNOSIS — R55 Syncope and collapse: Secondary | ICD-10-CM | POA: Diagnosis not present

## 2016-06-29 DIAGNOSIS — Z9989 Dependence on other enabling machines and devices: Secondary | ICD-10-CM

## 2016-06-29 DIAGNOSIS — G4733 Obstructive sleep apnea (adult) (pediatric): Secondary | ICD-10-CM

## 2016-06-29 DIAGNOSIS — R269 Unspecified abnormalities of gait and mobility: Secondary | ICD-10-CM | POA: Diagnosis not present

## 2016-06-29 NOTE — Progress Notes (Signed)
OFFICE NOTE  Chief Complaint:  Palpitations, shortness of breath and fatigue  Primary Care Physician: Brock Ra, PA-C  HPI:  Lori Underwood is a pleasant 45 year old female who presents today for evaluation of palpitations, progressive fatigue and chest discomfort. She has a strong family history of coronary disease both in her father and grandfather. She previously had breast cancer and underwent chemotherapy and had serial echocardiograms which showed no development of cardiomyopathy. She works as a Catering manager and recently is been feeling some worsening stress and anxiety and she's had to commute to a number of different locations. She is currently being treated for diverticulitis and is improving. She's developed some palpitations and chest discomfort. She also feels worsening fatigue and get short winded walking up stairs or long distances pulling her luggage. She feels palpitations which feel like a hard heartbeat and sound reminiscent of PVCs. She's had 3 separate events which were significant has had smaller events as well. Her chest discomfort is not always associated with exertion or relieved by rest. It does tend to be in the center of her chest.  06/29/2016  Lori Underwood returns today for follow-up. Since I last saw her in February she's been seen by a number different physicians and last seen by Almyra Deforest, PA-C. She apparently has had 4 different syncopal episodes all of which sound vasovagal. She also some other vague neurologic complaints including numbness and tingling in her arms, para seizures and paralysis occasionally of her left arm, neck pain, visual changes and gait difficulty. She barely seen a neurologist in [place], [place], who did not perform any nerve conduction tests or cerebral imaging however felt that there was not a neurologic cause of her symptoms. She has seen Dr. Claiborne Billings in our office for sleep apnea and is ordered for another split-night sleep  study. She also had recent fracture of her left foot and was found to have severe osteopenia. As mentioned she had a stress test earlier this year which was negative and an echo recently which was essentially normal. She also wore monitor which demonstrated sinus tachycardia but no arrhythmias, despite feeling palpitations on her symptom diary.  PMHx:  Past Medical History:  Diagnosis Date  . Back pain   . Cancer ([place])   . Diverticulitis   . Endometriosis 2009  . Generalized anxiety disorder   . IBS (irritable bowel syndrome)    mild  . Sleep apnea     Past Surgical History:  Procedure Laterality Date  . ABDOMINAL HYSTERECTOMY  2011, 2012  . BREAST LUMPECTOMY    . CESAREAN SECTION  1996  . OVARIAN CYST REMOVAL    . WISDOM TOOTH EXTRACTION  1980s    FAMHx:  Family History  Problem Relation Age of Onset  . Breast cancer Mother   . Anuerysm Father     heart  . Lung cancer Maternal Grandmother   . Heart attack Maternal Grandfather   . Anxiety disorder Brother   . Depression Brother   . Anxiety disorder Son   . Irritable bowel syndrome Son     SOCHx:   reports that she has been smoking Cigarettes.  She has been smoking about 0.50 packs per day. She has never used smokeless tobacco. She reports that she drinks about 1.8 oz of alcohol per week . She reports that she does not use drugs.  ALLERGIES:  Allergies  Allergen Reactions  . Erythromycin Nausea And Vomiting    ROS: Pertinent items noted  in HPI and remainder of comprehensive ROS otherwise negative.  HOME MEDS: Current Outpatient Prescriptions  Medication Sig Dispense Refill  . acetaminophen (TYLENOL) 325 MG tablet Take 325 mg by mouth daily as needed for mild pain or moderate pain.    Marland Kitchen ALPRAZolam (XANAX) 1 MG tablet Take 1 mg by mouth at bedtime as needed.      . cholecalciferol (VITAMIN D) 1000 units tablet Take 1,000 Units by mouth 2 (two) times daily.    . Cyanocobalamin (B-12 COMPLIANCE INJECTION IJ) Inject  as directed every 30 (thirty) days.    . magnesium citrate SOLN Take 1 Bottle by mouth every 30 (thirty) days.     No current facility-administered medications for this visit.     LABS/IMAGING: No results found for this or any previous visit (from the past 48 hour(s)). No results found.  WEIGHTS: Wt Readings from Last 3 Encounters:  06/29/16 180 lb (81.6 kg)  06/19/16 178 lb (80.7 kg)  05/21/16 173 lb 6.4 oz (78.7 kg)    VITALS: BP 113/70   Pulse (!) 59   Ht 5\' 3"  (1.6 m)   Wt 180 lb (81.6 kg)   SpO2 99%   BMI 31.89 kg/m   EXAM: General appearance: alert and no distress Neck: no carotid bruit, no JVD and thyroid not enlarged, symmetric, no tenderness/mass/nodules Lungs: clear to auscultation bilaterally Heart: regular rate and rhythm, S1, S2 normal, no murmur, click, rub or gallop Abdomen: soft, non-tender; bowel sounds normal; no masses,  no organomegaly Extremities: extremities normal, atraumatic, no cyanosis or edema and Left foot in a walking boot Pulses: 2+ and symmetric Skin: Skin color, texture, turgor normal. No rashes or lesions Neurologic: Grossly normal mildly anxious  EKG: Normal sinus rhythm at 61, possible left atrial enlargement  ASSESSMENT: 1. Recurrent syncope 2. Heart palpitations 3. Progressive shortness of breath and chest pain 4. Multifocal intermittent neurologic symptoms - ?MS  PLAN: 1.   Ms. Siemen has had a benign cardiovascular workup including wearing a monitor that demonstrated sinus tachycardia which may be inappropriate. She's had 4 syncopal episodes over the past year, all of which sound like vasovagal episodes with a clear prodrome. I've advised increasing water intake, salt tablets and lower extremity thigh-high compression stockings. There may be an underlying neurologic cause leading to autonomic dysfunction which is responsible for her symptoms. She's also had changes in vision, gait abnormalities, paresthesias and even possibly  paralysis. Workup by a neurologist in [facility] apparently was benign however she's never had CNS imaging or any nerve conduction tests. I like to refer her to Dr. Roddie Mc with [facility] to see if she could possibly have MS.   Pixie Casino, MD, [facility] Attending Cardiologist [place] 06/29/2016, 2:37 PM

## 2016-06-29 NOTE — Telephone Encounter (Signed)
Yes it was faxed 

## 2016-06-29 NOTE — Patient Instructions (Signed)
Medication Instructions:   USE SALT TABLETS AS NEEDED  Follow-Up:  Your physician wants you to follow-up in: [place] will receive a reminder letter in the mail two months in advance. If you don't receive a letter, please call our office to schedule the follow-up appointment.   If you need a refill on your cardiac medications before your next appointment, please call your pharmacy.   REFERRAL TO NEUROLOGY TO EVALUATE FOR MS

## 2016-07-06 ENCOUNTER — Telehealth (INDEPENDENT_AMBULATORY_CARE_PROVIDER_SITE_OTHER): Payer: Self-pay | Admitting: Orthopaedic Surgery

## 2016-07-06 NOTE — Telephone Encounter (Signed)
Called patient back to advise her I faxed updated form on 07/04/16

## 2016-07-06 NOTE — Telephone Encounter (Signed)
Patient called advised the medical leave of absence form need clarification on #4. Patient said she have until the 13th to get the information form completed. Patient said a date is needed.  The number to contact her is [phone]

## 2016-07-11 ENCOUNTER — Ambulatory Visit: Payer: BLUE CROSS/BLUE SHIELD | Admitting: Cardiovascular Disease

## 2016-07-11 ENCOUNTER — Ambulatory Visit (HOSPITAL_BASED_OUTPATIENT_CLINIC_OR_DEPARTMENT_OTHER): Payer: BLUE CROSS/BLUE SHIELD | Attending: Cardiovascular Disease | Admitting: Cardiovascular Disease

## 2016-07-11 VITALS — Ht 63.0 in | Wt 175.0 lb

## 2016-07-11 DIAGNOSIS — G4733 Obstructive sleep apnea (adult) (pediatric): Secondary | ICD-10-CM | POA: Diagnosis not present

## 2016-07-11 DIAGNOSIS — Z9989 Dependence on other enabling machines and devices: Secondary | ICD-10-CM

## 2016-07-11 DIAGNOSIS — R0683 Snoring: Secondary | ICD-10-CM

## 2016-07-12 ENCOUNTER — Telehealth: Payer: Self-pay | Admitting: Cardiovascular Disease

## 2016-07-12 NOTE — Telephone Encounter (Signed)
Left message to call back  

## 2016-07-12 NOTE — Telephone Encounter (Signed)
Spoke with patient and she feels like she has a lot of neurologic issues going on.  Advised to contact Neurologist, stated she has appointment with new Neurologist for second opinion in January. Advised to contact them to see if appointment could be moved up. Stated she was told by tech who did sleep study that there were a lot of neurologic disruptions  Patient would like Dr Claiborne Billings to review study and refer her to [facility] for oxygen therapy Will forward to Dr Claiborne Billings for review

## 2016-07-12 NOTE — Telephone Encounter (Signed)
Pt calling to see if she can get a referral to the [facility]--pls advise-ok to leave message on voicemail as she has another appt and may not be home

## 2016-07-16 ENCOUNTER — Encounter (INDEPENDENT_AMBULATORY_CARE_PROVIDER_SITE_OTHER): Payer: Self-pay | Admitting: Orthopaedic Surgery

## 2016-07-16 ENCOUNTER — Ambulatory Visit (INDEPENDENT_AMBULATORY_CARE_PROVIDER_SITE_OTHER): Payer: BLUE CROSS/BLUE SHIELD | Admitting: Orthopaedic Surgery

## 2016-07-16 ENCOUNTER — Telehealth (INDEPENDENT_AMBULATORY_CARE_PROVIDER_SITE_OTHER): Payer: Self-pay

## 2016-07-16 ENCOUNTER — Ambulatory Visit (INDEPENDENT_AMBULATORY_CARE_PROVIDER_SITE_OTHER): Payer: BLUE CROSS/BLUE SHIELD

## 2016-07-16 DIAGNOSIS — S92312D Displaced fracture of first metatarsal bone, left foot, subsequent encounter for fracture with routine healing: Secondary | ICD-10-CM

## 2016-07-16 NOTE — Telephone Encounter (Signed)
Faxed work note to [employer] to [employer] Number [phone]

## 2016-07-16 NOTE — Progress Notes (Signed)
Patient comes in today for her midfoot fractures. She is 6 weeks from the initial injury she has mild pain at times with stiffness in the morning. She is been icing the foot. Physical exam shows minimal tenderness to palpation of the midfoot. She has no significant swelling or discoloration or skin changes. X-rays of the left foot shows healed midfoot fractures with slight disuse osteopenia. We had a long discussion today about her return to work. 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 I think that she is not ready for full duty at this time. They do not have any sort of modified duty. Therefore wouldn't hold her out of work for another 8 weeks with 4 weeks of physical therapy for weeks of work conditioning and reevaluation. No x-rays needed on return.

## 2016-07-17 ENCOUNTER — Telehealth (INDEPENDENT_AMBULATORY_CARE_PROVIDER_SITE_OTHER): Payer: Self-pay

## 2016-07-17 NOTE — Telephone Encounter (Signed)
Faxed Dictation   To [employer] per pt fax number  [phone]

## 2016-07-20 NOTE — Telephone Encounter (Signed)
Returned call to patient-pt reports she spoke to [facility] this morning and has an appointment to be fitted for her CPAP next week.  Requesting sleep study results-advised Dr. Claiborne Billings will review the study and once he reviews we will give her a call with the results.  Pt aware and verbalized understanding.    Advised to call with further questions and/or concerns.

## 2016-07-20 NOTE — Telephone Encounter (Signed)
Attempt to return call-no answer, lmtcb. 

## 2016-07-20 NOTE — Telephone Encounter (Signed)
Returning your call. °

## 2016-07-20 NOTE — Procedures (Signed)
     Patient Name: Lori Underwood, Lori Underwood Date: 07/11/2016 Gender: Female D.O.B: [birth year] Age (years): 44 Referring Provider: Shelva Majestic MD, ABSM Height (inches): 63 Interpreting Physician: Shelva Majestic MD, ABSM Weight (lbs): 175 RPSGT: Earney [place] BMI: 31 MRN: [id number] Neck Size: 14.50  CLINICAL INFORMATION Sleep Study Type: NPSG  Indication for sleep study: OSA; prior sleep study 2006.  Epworth Sleepiness Score: 6  SLEEP STUDY TECHNIQUE As per the AASM Manual for the Scoring of Sleep and Associated Events v2.3 (April 2016) with a hypopnea requiring 4% desaturations.  The channels recorded and monitored were frontal, central and occipital EEG, electrooculogram (EOG), submentalis EMG (chin), nasal and oral airflow, thoracic and abdominal wall motion, anterior tibialis EMG, snore microphone, electrocardiogram, and pulse oximetry.  MEDICATIONS acetaminophen (TYLENOL) 325 MG tablet ALPRAZolam (XANAX) 1 MG tablet cholecalciferol (VITAMIN D) 1000 units tablet Cyanocobalamin (B-12 COMPLIANCE INJECTION IJ) magnesium citrate SOLN  Medications self-administered by patient taken the night of the study : N/A  SLEEP ARCHITECTURE The study was initiated at 9:38:56 PM and ended at 4:58:42 AM.  Sleep onset time was 73.2 minutes and the sleep efficiency was 75.4%. The total sleep time was 331.6 minutes. Wake after sleep onset (WASO) was 35 minutes  Stage REM latency was 171.0 minutes.  The patient spent 2.86% of the night in stage N1 sleep, 77.68% in stage N2 sleep, 0.00% in stage N3 and 19.45% in REM.  Alpha intrusion was absent.  Supine sleep was 57.44%.  RESPIRATORY PARAMETERS The overall apnea/hypopnea index (AHI) was 0.5 per hour; RDI 2.0/h.  There were 3 total apneas, including 3 obstructive, 0 central and 0 mixed apneas. There were 0 hypopneas and 8 RERAs.  The AHI during Stage REM sleep was 0.9 per hour.  AHI while supine was 0.9 per hour.  The mean  oxygen saturation was 91.74%. The minimum SpO2 during sleep was 86.00%.  Soft snoring was noted during this study.  CARDIAC DATA The 2 lead EKG demonstrated sinus rhythm. The mean heart rate was 59.98 beats per minute. Other EKG findings include: None. LEG MOVEMENT DATA The total PLMS were 20 with a resulting PLMS index of 3.62. Associated arousal with leg movement index was 2.4 .  IMPRESSIONS - No significant obstructive sleep apnea occurred during this study (AHI = 0.5/h; RDI 2.0; AHI during REM sleep 0.9/h.) - No significant central sleep apnea occurred during this study (CAI = 0.0/h). - Mild oxygen desaturation to a nadir of 86.00%. - Reduced sleep efficiency and prolonged latency to sleep onset. - The patient snored with Soft snoring volume. - No cardiac abnormalities were noted during this study. - Clinically significant periodic limb movements did not occur during sleep. No significant associated arousals.  DIAGNOSIS - Snoring  RECOMMENDATIONS - Pt does not meet criteria for CPAP therapy. - Efforts should be made to optimize nasal and oropharyngeal patency. - Consider alternatives for the treatment of snoring. - Avoid alcohol, sedatives and other CNS depressants that may worsen sleep apnea and disrupt normal sleep architecture. - Sleep hygiene should be reviewed to assess factors that may improve sleep quality. - Weight management (BMI 31) and regular exercise should be initiated or continued if appropriate.  [Electronically signed] 07/20/2016 11:13 AM  Shelva Majestic MD, [facility], [place], American Board of Sleep Medicine  NPI: [id number] [place] PH: [phone]   FX: [phone] [place]

## 2016-07-20 NOTE — Progress Notes (Signed)
Patient notified of sleep study results. 

## 2016-07-20 NOTE — Telephone Encounter (Signed)
Pt is returning Oyens phone call she has an appt @12  with another provider

## 2016-07-20 NOTE — Telephone Encounter (Signed)
Follow up  Pt voiced wanting number to CPAP company for supplies and appt with CPAP company.  Pt voiced wanting her results also if avail.  Please f/u with pt

## 2016-07-31 ENCOUNTER — Telehealth (INDEPENDENT_AMBULATORY_CARE_PROVIDER_SITE_OTHER): Payer: Self-pay | Admitting: Orthopaedic Surgery

## 2016-07-31 NOTE — Telephone Encounter (Signed)
Will have Mariann Laster call regarding recent sleep study results if  pt has not yet been contacted of result;  Did not meet CPAP criteria on the present study

## 2016-07-31 NOTE — Telephone Encounter (Signed)
[employer] calling for pt treatment plan and when she was taken out of work.   Katharine Look [phone]

## 2016-08-01 NOTE — Telephone Encounter (Signed)
Called and no answer LMOM, If she wants I can fax her the info please let her leave fax number.

## 2016-08-03 ENCOUNTER — Encounter (HOSPITAL_BASED_OUTPATIENT_CLINIC_OR_DEPARTMENT_OTHER): Payer: BLUE CROSS/BLUE SHIELD

## 2016-08-08 ENCOUNTER — Telehealth (INDEPENDENT_AMBULATORY_CARE_PROVIDER_SITE_OTHER): Payer: Self-pay | Admitting: *Deleted

## 2016-08-08 NOTE — Telephone Encounter (Signed)
Also need restrictions and limitations and out of work time frame, est return to work

## 2016-08-08 NOTE — Telephone Encounter (Signed)
Fax number is: [phone]

## 2016-08-08 NOTE — Telephone Encounter (Signed)
Out of work for 8 weeks since last visit

## 2016-08-08 NOTE — Telephone Encounter (Signed)
See message below °

## 2016-08-09 NOTE — Telephone Encounter (Signed)
Faxed work note to fax number below.

## 2016-08-10 ENCOUNTER — Telehealth (INDEPENDENT_AMBULATORY_CARE_PROVIDER_SITE_OTHER): Payer: Self-pay | Admitting: *Deleted

## 2016-08-10 NOTE — Telephone Encounter (Signed)
[employer] called stating pt has been out on dissability since sept but they have only received office notes oct- present. Asking if pt was seen in sept. [id number] claim [phone]

## 2016-08-13 NOTE — Telephone Encounter (Signed)
I will try again tomorrow.

## 2016-08-13 NOTE — Telephone Encounter (Signed)
Called [employer]. They are closed due to the holidays.

## 2016-08-14 NOTE — Telephone Encounter (Signed)
Faxed to [phone]

## 2016-08-16 ENCOUNTER — Ambulatory Visit: Payer: BLUE CROSS/BLUE SHIELD | Admitting: Neurology

## 2016-08-17 ENCOUNTER — Telehealth: Payer: Self-pay

## 2016-08-17 NOTE — Telephone Encounter (Signed)
I called pt. Pt is agreeable to rescheduling her appt until 09/05/16 at 3:00pm. Pt verbalized understanding of new appt date and time.

## 2016-08-17 NOTE — Telephone Encounter (Signed)
I called pt and left a VM to advise her that our office will be closed on 08/16/16.

## 2016-08-23 ENCOUNTER — Telehealth (INDEPENDENT_AMBULATORY_CARE_PROVIDER_SITE_OTHER): Payer: Self-pay | Admitting: *Deleted

## 2016-08-23 ENCOUNTER — Telehealth (INDEPENDENT_AMBULATORY_CARE_PROVIDER_SITE_OTHER): Payer: Self-pay | Admitting: Orthopaedic Surgery

## 2016-08-23 NOTE — Telephone Encounter (Signed)
Patient is needing a work note for her job. She has a very detailed version of what it needs to say on it. Her CB # [phone]. Thank you

## 2016-08-23 NOTE — Telephone Encounter (Signed)
RTW date is still  09-09-16, Employer is needing this on file in advance due to online training that is required. She is still requesting that you reach her prior to writing this note.  cb  [phone]

## 2016-08-23 NOTE — Telephone Encounter (Signed)
yes

## 2016-08-23 NOTE — Telephone Encounter (Signed)
Called pt and she would like an anticipated work note. RTW (09/09/16). She would like this to be faxed to [employer]. [phone]

## 2016-08-23 NOTE — Telephone Encounter (Signed)
Please advise. See message below.  

## 2016-08-23 NOTE — Telephone Encounter (Signed)
PER DEBBIE'S NOTE   RTW date is still  09-09-16, Employer is needing this on file in advance due to online training that is required. She is still requesting that you reach her prior to writing this note.  cb  [phone]

## 2016-08-24 NOTE — Telephone Encounter (Signed)
Faxed to [phone] 

## 2016-08-28 ENCOUNTER — Ambulatory Visit (INDEPENDENT_AMBULATORY_CARE_PROVIDER_SITE_OTHER): Payer: BLUE CROSS/BLUE SHIELD | Admitting: Orthopaedic Surgery

## 2016-08-30 ENCOUNTER — Institutional Professional Consult (permissible substitution): Payer: BLUE CROSS/BLUE SHIELD | Admitting: Cardiovascular Disease

## 2016-09-04 ENCOUNTER — Telehealth (INDEPENDENT_AMBULATORY_CARE_PROVIDER_SITE_OTHER): Payer: Self-pay | Admitting: Orthopaedic Surgery

## 2016-09-04 NOTE — Telephone Encounter (Signed)
Returned call to patient left message to return call   [phone]

## 2016-09-05 ENCOUNTER — Institutional Professional Consult (permissible substitution): Payer: Self-pay | Admitting: Neurology

## 2016-09-06 ENCOUNTER — Encounter (INDEPENDENT_AMBULATORY_CARE_PROVIDER_SITE_OTHER): Payer: Self-pay | Admitting: Orthopaedic Surgery

## 2016-09-06 ENCOUNTER — Telehealth (INDEPENDENT_AMBULATORY_CARE_PROVIDER_SITE_OTHER): Payer: Self-pay | Admitting: Orthopaedic Surgery

## 2016-09-06 ENCOUNTER — Ambulatory Visit (INDEPENDENT_AMBULATORY_CARE_PROVIDER_SITE_OTHER): Payer: BLUE CROSS/BLUE SHIELD | Admitting: Orthopaedic Surgery

## 2016-09-06 DIAGNOSIS — S92342D Displaced fracture of fourth metatarsal bone, left foot, subsequent encounter for fracture with routine healing: Secondary | ICD-10-CM | POA: Diagnosis not present

## 2016-09-06 DIAGNOSIS — S92322D Displaced fracture of second metatarsal bone, left foot, subsequent encounter for fracture with routine healing: Secondary | ICD-10-CM | POA: Diagnosis not present

## 2016-09-06 DIAGNOSIS — S92312D Displaced fracture of first metatarsal bone, left foot, subsequent encounter for fracture with routine healing: Secondary | ICD-10-CM

## 2016-09-06 NOTE — Telephone Encounter (Signed)
error 

## 2016-09-06 NOTE — Progress Notes (Signed)
   Office Visit Note   Patient: Lori Underwood           Date of Birth: 02/24/1971           MRN: [id number] Visit Date: 09/06/2016              Requested by: Brock Ra, PA-C [address] [place]  [address] PCP: Brock Ra, PA-C   Assessment & Plan: Visit Diagnoses:  1. Closed displaced fracture of first metatarsal bone of left foot with routine healing, subsequent encounter   2. Closed displaced fracture of second metatarsal bone of left foot with routine healing, subsequent encounter   3. Closed displaced fracture of fourth metatarsal bone of left foot with routine healing, subsequent encounter     Plan: Patient has reached MMI at this point. I'll release her back to work on 09/09/2016 with no restrictions. Follow-up with me as needed.  Follow-Up Instructions: Return if symptoms worsen or fail to improve.   Orders:  No orders of the defined types were placed in this encounter.  No orders of the defined types were placed in this encounter.     Procedures: No procedures performed   Clinical Data: No additional findings.   Subjective: Chief Complaint  Patient presents with  . Left Foot - Follow-up    Patient follows up today for her midfoot fractures. She is doing much better and ready go back to work on 09/09/2016. She has some tenderness and discomfort in the morning and with weather changes but overall she is doing well.    Review of Systems   Objective: Vital Signs: There were no vitals taken for this visit.  Physical Exam  Ortho Exam Exam of the left foot shows no swelling or worse tenderness to palpation of the midfoot. Specialty Comments:  No specialty comments available.  Imaging: No results found.   PMFS History: Patient Active Problem List   Diagnosis Date Noted  . Vasovagal episode 06/29/2016  . OSA on CPAP 06/29/2016  . Gait disturbance 06/29/2016  . Palpitations 09/14/2015  . Shortness of breath 09/14/2015   Past  Medical History:  Diagnosis Date  . Back pain   . Cancer ([place])   . Diverticulitis   . Endometriosis 2009  . Generalized anxiety disorder   . IBS (irritable bowel syndrome)    mild  . Sleep apnea     Family History  Problem Relation Age of Onset  . Breast cancer Mother   . Anuerysm Father     heart  . Lung cancer Maternal Grandmother   . Heart attack Maternal Grandfather   . Anxiety disorder Brother   . Depression Brother   . Anxiety disorder Son   . Irritable bowel syndrome Son     Past Surgical History:  Procedure Laterality Date  . ABDOMINAL HYSTERECTOMY  2011, 2012  . BREAST LUMPECTOMY    . CESAREAN SECTION  1996  . OVARIAN CYST REMOVAL    . WISDOM TOOTH EXTRACTION  1980s   Social History   Occupational History  . flight attendant  Other    [place] History Main Topics  . Smoking status: Current Every Day Smoker    Packs/day: 0.50    Types: Cigarettes  . Smokeless tobacco: Never Used  . Alcohol use 1.8 oz/week    3 Standard drinks or equivalent per week  . Drug use: No  . Sexual activity: Not on file

## 2016-09-07 ENCOUNTER — Ambulatory Visit (INDEPENDENT_AMBULATORY_CARE_PROVIDER_SITE_OTHER): Payer: BLUE CROSS/BLUE SHIELD | Admitting: Orthopaedic Surgery

## 2016-10-04 ENCOUNTER — Institutional Professional Consult (permissible substitution): Payer: BLUE CROSS/BLUE SHIELD | Admitting: Cardiovascular Disease

## 2017-01-14 ENCOUNTER — Other Ambulatory Visit: Payer: Self-pay | Admitting: Otolaryngology

## 2017-01-14 DIAGNOSIS — J329 Chronic sinusitis, unspecified: Secondary | ICD-10-CM

## 2017-01-16 ENCOUNTER — Ambulatory Visit
Admission: RE | Admit: 2017-01-16 | Discharge: 2017-01-16 | Disposition: A | Discharge status: Home / Self Care | Payer: BLUE CROSS/BLUE SHIELD | Source: Ambulatory Visit | Attending: Otolaryngology | Admitting: Otolaryngology

## 2017-01-16 DIAGNOSIS — J329 Chronic sinusitis, unspecified: Secondary | ICD-10-CM

## 2017-01-16 MED ORDER — IOPAMIDOL (ISOVUE-300) INJECTION 61%
75.0000 mL | Freq: Once | INTRAVENOUS | Status: AC | PRN
  Administered 2017-01-16: 75 mL via INTRAVENOUS

## 2017-02-11 ENCOUNTER — Telehealth: Payer: Self-pay | Admitting: Internal Medicine

## 2017-02-11 NOTE — Telephone Encounter (Signed)
Pt said the last couple of days,she have been able to hear her heartbeat in her her right ear.She is a little nervous,wonder if she might need to be seen?

## 2017-02-11 NOTE — Telephone Encounter (Signed)
LMTCB Patient is due for 6 month OV with Dr. Debara Pickett

## 2017-02-11 NOTE — Telephone Encounter (Signed)
Patient has been scheduled for first available appt with Dr. Debara Pickett. There are no sooner appts available. Per patient, no need to call back unless she can come in sooner. Encounter closed.

## 2017-02-11 NOTE — Telephone Encounter (Signed)
See other telephone note from 02/11/17

## 2017-02-11 NOTE — Telephone Encounter (Signed)
New Message    Pt is calling regarding headache and she can hear her heartbeat in her ears.  You dont have to call her back unless you can get her in sooner then august with Dr Debara Pickett

## 2017-02-14 ENCOUNTER — Telehealth: Payer: Self-pay | Admitting: Internal Medicine

## 2017-02-14 NOTE — Telephone Encounter (Signed)
Patient calling, states that she has been experiencing ongoing headaches, can hear heartbeat in right ear, mild cough for about a 1 week. Patient states that one morning she woke up and right arm "was asleep and had difficulty getting blood flowing." Also patient states that one morning she had trouble moving jaw.  Patient is worried that there is an issue with her 'blood flow." She is scheduled for 03-04-17 at the moment.

## 2017-02-14 NOTE — Telephone Encounter (Signed)
Spoke with pt looks like these sx have been going on since her last visit with Dr Debara Pickett with a few additions and she states that "heart symptoms' run in her family and this makes her feel uneasy and feels better after talking to the nurses reassuring her. She states that she is on the the wait list for neurology and PCP is up dated. She states that she will wait until her scheduled appt. Added to [facility] wait list

## 2017-03-04 ENCOUNTER — Ambulatory Visit (INDEPENDENT_AMBULATORY_CARE_PROVIDER_SITE_OTHER): Payer: BLUE CROSS/BLUE SHIELD | Admitting: Physician Assistant

## 2017-03-04 ENCOUNTER — Encounter: Payer: Self-pay | Admitting: Physician Assistant

## 2017-03-04 VITALS — BP 120/84 | HR 57 | Ht 62.5 in | Wt 184.0 lb

## 2017-03-04 DIAGNOSIS — G4733 Obstructive sleep apnea (adult) (pediatric): Secondary | ICD-10-CM

## 2017-03-04 DIAGNOSIS — R42 Dizziness and giddiness: Secondary | ICD-10-CM

## 2017-03-04 DIAGNOSIS — M79604 Pain in right leg: Secondary | ICD-10-CM | POA: Diagnosis not present

## 2017-03-04 DIAGNOSIS — M79605 Pain in left leg: Secondary | ICD-10-CM | POA: Diagnosis not present

## 2017-03-04 NOTE — Patient Instructions (Signed)
Medication Instructions:   NO CHANGE  Testing/Procedures:  Your physician has requested that you have a carotid duplex. This test is an ultrasound of the carotid arteries in your neck. It looks at blood flow through these arteries that supply the brain with blood. Allow one hour for this exam. There are no restrictions or special instructions.    Follow-Up:  Your physician wants you to follow-up in: [place] will receive a reminder letter in the mail two months in advance. If you don't receive a letter, please call our office to schedule the follow-up appointment.   If you need a refill on your cardiac medications before your next appointment, please call your pharmacy.

## 2017-03-04 NOTE — Progress Notes (Signed)
Cardiology Office Note    Date:  03/05/2017   ID:  Lori Underwood, DOB 10-12-1970, MRN [id number]  PCP:  Brock Ra, PA-C  Cardiologist:  Dr. Debara Pickett  Chief Complaint  Patient presents with  . Follow-up    seen for Dr. Debara Pickett    History of Present Illness:  Lori Underwood is a 46 y.o. female with PMH of OSA who was seen by Dr. Debara Pickett on 09/13/2015 for evaluation of palpitation, progressive fatigue and chest discomfort. She had significant family history of CAD. She also had a history of breast cancer and underwent chemotherapy and had serial echocardiograms which showed no development of cardiomyopathy. She worked as a Catering manager. Given her chest discomfort and palpitation, was recommended for her to undergo stress testing and obtain an echocardiogram and also obtain of 2 weeks event monitor. She had ETT on 10/12/2015 that showed hypertensive response to exercise, upsloping ST segment depression noted during stress test in 2, 3, aVF, V5 and V6 leads which are not consistent with ischemia. It does not appear that echocardiogram or the two-week event monitor was done. I last saw her last year for severe dizziness and several recurrent syncope felt to be vasovagal in nature. Two-week event monitor obtained October was negative other than sinus rhythm. Echocardiogram performed on 06/06/2016 showed EF 60-65%. She did have sleep study late last year, she did not meet the criteria for CPAP.  Patient presents today for cardiology office visit. She has been generally doing very well. However she continued had to have dizziness, she has been having rushing sound in her right neck radiating up to her right forehead. She also hear her heartbeat on that side as well. I did not appreciate significant carotid bruit on physical exam. I will obtain a carotid ultrasound. She also complaining of bilateral lower extremity pain. I checked her pulse, she has 2+ dorsalis pedis pulse. Suspicion for significant lower  extremity arterial stenosis quite low at this time.    Past Medical History:  Diagnosis Date  . Back pain   . Cancer ([place])   . Diverticulitis   . Endometriosis 2009  . Generalized anxiety disorder   . IBS (irritable bowel syndrome)    mild  . Sleep apnea     Past Surgical History:  Procedure Laterality Date  . ABDOMINAL HYSTERECTOMY  2011, 2012  . BREAST LUMPECTOMY    . CESAREAN SECTION  1996  . OVARIAN CYST REMOVAL    . WISDOM TOOTH EXTRACTION  1980s    Current Medications: Outpatient Medications Prior to Visit  Medication Sig Dispense Refill  . acetaminophen (TYLENOL) 325 MG tablet Take 325 mg by mouth daily as needed for mild pain or moderate pain.    Marland Kitchen ALPRAZolam (XANAX) 1 MG tablet Take 1 mg by mouth at bedtime as needed.      . cholecalciferol (VITAMIN D) 1000 units tablet Take 1,000 Units by mouth 2 (two) times daily.    . Cyanocobalamin (B-12 COMPLIANCE INJECTION IJ) Inject as directed every 30 (thirty) days.    . magnesium citrate SOLN Take 1 Bottle by mouth every 30 (thirty) days.     No facility-administered medications prior to visit.      Allergies:   Erythromycin   Social History   Social History  . Marital status: Divorced    Spouse name: N/A  . Number of children: 2  . Years of education: 12   Occupational History  . flight attendant  Other  [place] History Main Topics  . Smoking status: Current Every Day Smoker    Packs/day: 0.50    Types: Cigarettes  . Smokeless tobacco: Never Used  . Alcohol use 1.8 oz/week    3 Standard drinks or equivalent per week  . Drug use: No  . Sexual activity: Not Asked   Other Topics Concern  . None   Social History Narrative   epworth sleepiness scale = 3 (09/13/2015)     Family History:  The patient's family history includes Anuerysm in her father; Anxiety disorder in her brother and son; Breast cancer in her mother; Depression in her brother; Heart attack in her maternal  grandfather; Irritable bowel syndrome in her son; Lung cancer in her maternal grandmother.   ROS:   Please see the history of present illness.    ROS All other systems reviewed and are negative.   PHYSICAL EXAM:   VS:  BP 120/84   Pulse (!) 57   Ht 5' 2.5" (1.588 m)   Wt 184 lb (83.5 kg)   BMI 33.12 kg/m    GEN: Well nourished, well developed, in no acute distress  HEENT: normal  Neck: no JVD, carotid bruits, or masses Cardiac: RRR; no murmurs, rubs, or gallops,no edema  Respiratory:  clear to auscultation bilaterally, normal work of breathing GI: soft, nontender, nondistended, + BS MS: no deformity or atrophy  Skin: warm and dry, no rash Neuro:  Alert and Oriented x 3, Strength and sensation are intact Psych: euthymic mood, full affect  Wt Readings from Last 3 Encounters:  03/04/17 184 lb (83.5 kg)  07/11/16 175 lb (79.4 kg)  06/29/16 180 lb (81.6 kg)      Studies/Labs Reviewed:   EKG:  EKG is ordered today.  The ekg ordered today demonstrates Sinus brady HR 57, no significant ST-T wave changes  Recent Labs: 05/02/2016: BUN 16; Creatinine, Ser 0.69; Hemoglobin 13.4; Platelets 287; Potassium 3.6; Sodium 137 05/23/2016: TSH 0.98   Lipid Panel No results found for: CHOL, TRIG, HDL, CHOLHDL, VLDL, LDLCALC, LDLDIRECT  Additional studies/ records that were reviewed today include:   Cardiac event monitor 05/23/2016 Study Highlights   Monitor shows sinus. Patient diary reported fluttering, rapid heartrate and lightheadedness, however, HR <100 and no arrhythmias noted.     Echo 06/06/2016 LV EF: 60% -   65%  Study Conclusions  - Left ventricle: The cavity size was normal. Systolic function was   normal. The estimated ejection fraction was in the range of 60%   to 65%. Wall motion was normal; there were no regional wall   motion abnormalities. Left ventricular diastolic function   parameters were normal.   ASSESSMENT:    1. Dizziness   2. OSA (obstructive  sleep apnea)   3. Pain in both lower extremities      PLAN:  In order of problems listed above:  1. Dizziness: She has had extensive workups such as two-week event monitor, echocardiogram and ETT in 2017, essentially all negative. Given persistent dizziness, I will obtain a carotid ultrasound. Otherwise if this is negative, I would not recommend any additional cardiology workup.  2. Bilateral lower extremity pain: She says she has been having bilateral lower extremity pain. My suspicion for significant lower extremity arterial stenosis relatively low at this time given very good 2+ bilateral dorsalis pedis pulse. I will hold off on ABI as this time.   Medication Adjustments/Labs and Tests Ordered: Current medicines are reviewed at length with the  patient today.  Concerns regarding medicines are outlined above.  Medication changes, Labs and Tests ordered today are listed in the Patient Instructions below. Patient Instructions  Medication Instructions:   NO CHANGE  Testing/Procedures:  Your physician has requested that you have a carotid duplex. This test is an ultrasound of the carotid arteries in your neck. It looks at blood flow through these arteries that supply the brain with blood. Allow one hour for this exam. There are no restrictions or special instructions.    Follow-Up:  Your physician wants you to follow-up in: [place] will receive a reminder letter in the mail two months in advance. If you don't receive a letter, please call our office to schedule the follow-up appointment.   If you need a refill on your cardiac medications before your next appointment, please call your pharmacy.     Hilbert Corrigan, [place]  03/05/2017 1:22 PM    [facility] [address], [place], [place]  [address] Phone: [phone]; Fax: [phone]

## 2017-03-05 ENCOUNTER — Encounter: Payer: Self-pay | Admitting: Physician Assistant

## 2017-03-20 ENCOUNTER — Ambulatory Visit: Payer: BLUE CROSS/BLUE SHIELD | Admitting: Internal Medicine

## 2017-04-03 ENCOUNTER — Ambulatory Visit (HOSPITAL_COMMUNITY)
Admission: RE | Admit: 2017-04-03 | Discharge: 2017-04-03 | Disposition: A | Discharge status: Home / Self Care | Payer: BLUE CROSS/BLUE SHIELD | Source: Ambulatory Visit | Attending: Cardiology | Admitting: Cardiology

## 2017-04-03 DIAGNOSIS — R42 Dizziness and giddiness: Secondary | ICD-10-CM | POA: Insufficient documentation

## 2017-04-03 DIAGNOSIS — Z720 Tobacco use: Secondary | ICD-10-CM | POA: Insufficient documentation

## 2017-04-05 ENCOUNTER — Telehealth: Payer: Self-pay | Admitting: Physician Assistant

## 2017-04-05 DIAGNOSIS — Z83438 Family history of other disorder of lipoprotein metabolism and other lipidemia: Secondary | ICD-10-CM

## 2017-04-05 NOTE — Telephone Encounter (Signed)
F/u message ° °Pt returning RN call .please call back to discuss  °

## 2017-04-05 NOTE — Telephone Encounter (Signed)
Follow up   Pt is returning call    

## 2017-04-08 NOTE — Telephone Encounter (Signed)
Spoke to patient. Discussed results of carotid US. Pt verbalized understanding.  On topic of carotid artery health, we discussed cholesterol, HTN. Pt states she does not remember ever having a fasting cholesterol test and wanted to see if we could order.  Pt seems anxious to check into this. States she would be appreciative.  She has plan to f/u w neurology concerning her continued dizziness, headaches.  Informed pt I would check w provider.  OK to order fasting cholesterol test?

## 2017-04-08 NOTE — Telephone Encounter (Signed)
Follow up ° ° °Pt is returning call to [name].  °

## 2017-04-08 NOTE — Telephone Encounter (Signed)
Left msg for patient w results (OK per DPR) and advice to call if concerns. Released results to [facility] w provider comments.

## 2017-04-09 NOTE — Telephone Encounter (Signed)
Yes, I think given her age and family history it is quite reasonable to obtain fasting lipid panel once a year.

## 2017-04-09 NOTE — Telephone Encounter (Signed)
Ordered lab test. Called patient, no answer, goes to full VM box.

## 2017-04-09 NOTE — Telephone Encounter (Signed)
To addend, during my conversation w patient, she did identify family history of high cholesterol.

## 2017-04-18 NOTE — Telephone Encounter (Signed)
Left msg w recommendations (OK per DPR) and to call office to affirm understanding of instructions.

## 2017-09-18 ENCOUNTER — Telehealth: Payer: Self-pay | Admitting: Internal Medicine

## 2017-09-18 NOTE — Telephone Encounter (Signed)
Returned call to patient.She stated she woke up last night with heaviness in chest,lasted appox 10 min.Stated she feel ok this morning, except pain in left knee.She has a call into her PCP about her knee pain.She scheduled appointment with Almyra Deforest PA 09/19/17 at 2:00 pm.Advised to keep appointment with [facility].Advised if she has chest heaviness again go to ED.

## 2017-09-18 NOTE — Telephone Encounter (Signed)
New message   Pt c/o of Chest Pain: STAT if CP now or developed within 24 hours  1. Are you having CP right now? no  2. Are you experiencing any other symptoms (ex. SOB, nausea, vomiting, sweating)? Extreme fatigue  3. How long have you been experiencing CP? Several months but was worst last night Since last night  4. Is your CP continuous or coming and going? Coming and going  5. Have you taken Nitroglycerin? No  Pt says that she has a burning bubbling sensation in her chest and says she has been really stressed, and a cold feeling in her body ?

## 2017-09-19 ENCOUNTER — Ambulatory Visit (INDEPENDENT_AMBULATORY_CARE_PROVIDER_SITE_OTHER): Payer: BLUE CROSS/BLUE SHIELD | Admitting: Physician Assistant

## 2017-09-19 ENCOUNTER — Encounter: Payer: Self-pay | Admitting: Physician Assistant

## 2017-09-19 VITALS — BP 126/80 | HR 59 | Ht 62.5 in | Wt 184.0 lb

## 2017-09-19 DIAGNOSIS — R209 Unspecified disturbances of skin sensation: Secondary | ICD-10-CM

## 2017-09-19 DIAGNOSIS — G4733 Obstructive sleep apnea (adult) (pediatric): Secondary | ICD-10-CM | POA: Diagnosis not present

## 2017-09-19 DIAGNOSIS — R072 Precordial pain: Secondary | ICD-10-CM

## 2017-09-19 MED ORDER — METOPROLOL TARTRATE 25 MG PO TABS: 25.0000 mg | ORAL_TABLET | ORAL | 0 refills | Status: DC

## 2017-09-19 NOTE — Progress Notes (Signed)
Cardiology Office Note    Date:  09/20/2017   ID:  Lori Underwood, DOB [birth year], MRN [id number]  PCP:  Brock Ra, PA-C  Cardiologist:  Dr. Debara Pickett  Chief Complaint  Patient presents with  . Follow-up    Feet and hands feel colder than normail. Right arm feels weak.  . Chest Pain    Heaviness.  Lori Underwood Headache  . Shortness of Breath    History of Present Illness:  Lori Underwood is a 47 y.o. female  with PMH of OSA who was seen by Dr. Debara Pickett on 09/13/2015 for evaluation of palpitation, progressive fatigue and chest discomfort. She had significant family history of CAD. She also had a history of breast cancer and underwent chemotherapy and had serial echocardiograms which showed no development of cardiomyopathy. She worked as a Catering manager. Given her chest discomfort and palpitation, it was recommended for her to undergo stress testing and obtain an echocardiogram and also obtain of 2 weeks event monitor. She had ETT on 10/12/2015 that showed hypertensive response to exercise, upsloping ST segment depression noted during stress test in 2, 3, aVF, V5 and V6 leads which are not consistent with ischemia. Two-week event monitor obtained in October 2017 was negative other than sinus rhythm. Echocardiogram performed on 06/06/2016 showed EF 60-65%. She did have sleep study late last year, she did not meet the criteria for CPAP.   Patient presents today for evaluation of chest pain.  She occasionally has some precordial chest pain at rest and with walking.  However 2 nights ago, she woke up in the middle the night having significant substernal chest pain.  She almost went to the hospital, however decided to try to sleep it off.  It lasted about 10-15 minutes before finally easing off.  After she woke up the next day, she finally took aspirin.  She says she was feeling very fatigued.  She Artie had previous ETT and echocardiogram in 2017, given her significant family history of CAD, I think it is prudent  to obtain more information.  I will obtain a stat troponin today, if negative, I plan to proceed with a coronary CT with FFR.  If coronary CT with FFR is negative, she can follow-up with cardiology later only on a as needed basis.  She continued to complain of bilateral lower extremity coldness, however I do not think this is a vascular issue as she has very strong posterior tibial and dorsalis pedis pulse.  I did recommend for her to obtain a CBC and a TSH on next week's follow-up with her PCP to rule out other secondary causes of extremity coldness.   Past Medical History:  Diagnosis Date  . Back pain   . Cancer ([place])   . Diverticulitis   . Endometriosis 2009  . Generalized anxiety disorder   . IBS (irritable bowel syndrome)    mild  . Sleep apnea     Past Surgical History:  Procedure Laterality Date  . ABDOMINAL HYSTERECTOMY  2011, 2012  . BREAST LUMPECTOMY    . CESAREAN SECTION  1996  . OVARIAN CYST REMOVAL    . WISDOM TOOTH EXTRACTION  1980s    Current Medications: Outpatient Medications Prior to Visit  Medication Sig Dispense Refill  . acetaminophen (TYLENOL) 325 MG tablet Take 325 mg by mouth daily as needed for mild pain or moderate pain.    Lori Underwood ALPRAZolam (XANAX) 1 MG tablet Take 1 mg by mouth at bedtime as needed.      Lori Underwood  cholecalciferol (VITAMIN D) 1000 units tablet Take 1,000 Units by mouth 2 (two) times daily.    . Cyanocobalamin (B-12 COMPLIANCE INJECTION IJ) Inject as directed every 30 (thirty) days.    . magnesium citrate SOLN Take 1 Bottle by mouth every 30 (thirty) days.     No facility-administered medications prior to visit.      Allergies:   Erythromycin   Social History   Socioeconomic History  . Marital status: Divorced    Spouse name: None  . Number of children: 2  . Years of education: 71  . Highest education level: None  Social Needs  . Financial resource strain: None  . Food insecurity - worry: None  . Food insecurity - inability: None  .  Transportation needs - medical: None  . Transportation needs - non-medical: None  Occupational History  . Occupation: Artist: [employer]  Tobacco Use  . Smoking status: Current Every Day Smoker    Packs/day: 0.50    Types: Cigarettes  . Smokeless tobacco: Never Used  Substance and Sexual Activity  . Alcohol use: Yes    Alcohol/week: 1.8 oz    Types: 3 Standard drinks or equivalent per week  . Drug use: No  . Sexual activity: None  Other Topics Concern  . None  Social History Narrative   epworth sleepiness scale = 3 (09/13/2015)     Family History:  The patient's family history includes Anuerysm in her father; Anxiety disorder in her brother and son; Breast cancer in her mother; Depression in her brother; Heart attack in her maternal grandfather; Irritable bowel syndrome in her son; Lung cancer in her maternal grandmother.   ROS:   Please see the history of present illness.    ROS All other systems reviewed and are negative.   PHYSICAL EXAM:   VS:  BP 126/80   Pulse (!) 59   Ht 5' 2.5" (1.588 m)   Wt 184 lb (83.5 kg)   BMI 33.12 kg/m    GEN: Well nourished, well developed, in no acute distress  HEENT: normal  Neck: no JVD, carotid bruits, or masses Cardiac: RRR; no murmurs, rubs, or gallops,no edema  Respiratory:  clear to auscultation bilaterally, normal work of breathing GI: soft, nontender, nondistended, + BS MS: no deformity or atrophy  Skin: warm and dry, no rash Neuro:  Alert and Oriented x 3, Strength and sensation are intact Psych: euthymic mood, full affect  Wt Readings from Last 3 Encounters:  09/19/17 184 lb (83.5 kg)  03/04/17 184 lb (83.5 kg)  07/11/16 175 lb (79.4 kg)      Studies/Labs Reviewed:   EKG:  EKG is ordered today.  The ekg ordered today demonstrates normal sinus rhythm without significant ST-T wave changes  Recent Labs: No results found for requested labs within last 8760 hours.    Lipid Panel No results found for: CHOL, TRIG, HDL, CHOLHDL, VLDL, LDLCALC, LDLDIRECT  Additional studies/ records that were reviewed today include:   ETT 10/12/2015 Study Highlights    Blood pressure demonstrated a hypertensive response to exercise.  Upsloping ST segment depression ST segment depression was noted during stress in the II, III, aVF, V5 and V6 leads. This is not consistent with ischemia.  Negative, adequate stress test.        Echo 06/06/2016 LV EF: 60% -   65%  Study Conclusions  - Left ventricle: The cavity size was normal. Systolic function was  normal. The estimated ejection fraction was in the range of 60%   to 65%. Wall motion was normal; there were no regional wall   motion abnormalities. Left ventricular diastolic function   parameters were normal.   ASSESSMENT:    1. Precordial pain   2. OSA (obstructive sleep apnea)   3. Cold extremity without peripheral vascular disease      PLAN:  In order of problems listed above:  1. Precordial chest pain: Patient has recurrent chest pain, previous echocardiogram was normal in 2017.  ETT in March 2017 was also low risk.  She has significant family history of coronary artery disease and is quite concerned about the possibility of coronary artery disease in her case as well.  Plan to obtain coronary CT with FFR.  She had a very strong episode of chest pain 2 nights ago, she says she almost went to the ED.  I will obtain a stat troponin to make sure she did not have an acute event.  2. Extremity coldness: This is the same complaint she had last time as well.  She complains that her feet are always cold.  She continued to have strong lower extremity pulse, suspicion for vascular disease is quite low.  I recommended her to have a CBC and a TSH to rule out secondary causes for lower extremity coldness.  I do not see any discoloration to suggest Reynauld's disease.    Medication Adjustments/Labs and Tests  Ordered: Current medicines are reviewed at length with the patient today.  Concerns regarding medicines are outlined above.  Medication changes, Labs and Tests ordered today are listed in the Patient Instructions below. Patient Instructions  Medication Instructions: Your physician recommends that you continue on your current medications as directed. Please refer to the Current Medication list given to you today.  Labwork: Your physician recommends that you return for lab work today: STAT Troponin  Your physician recommends that you have lab work at your PCP office NEXT MONDAY--CBC, TSH. Please have the results faxed to [phone]--ATTN: Almyra Deforest, PA.    Testing/Procedures:  Please arrive at the [facility] main entrance of [facility] at                            AM (30-45 minutes prior to test start time)  [facility] [place], [place] [address] [phone]  Proceed to the [facility] Radiology Department (First Floor).  Please follow these instructions carefully (unless otherwise directed):  Labwork prior to test--within 1 week--BMET, to check kidney function due to administration of IV contrast dye.  On the Night Before the Test: . Drink plenty of water. . Do not consume any caffeinated/decaffeinated beverages or chocolate 12 hours prior to your test. . Do not take any antihistamines 12 hours prior to your test.  On the Day of the Test: . Drink plenty of water. Do not drink any water within one hour of the test. . Do not eat any food 4 hours prior to the test. . You may take your regular medications prior to the test. . IF NOT ON A BETA BLOCKER - Take 25 mg of lopressor (metoprolol) one hour before the test.  After the Test: . Drink plenty of water. . After receiving IV contrast, you may experience a mild flushed feeling. This is normal. . On occasion, you may experience a mild rash up to 24 hours after the test.  This is not  dangerous. If this occurs, you can take Benadryl 25 mg and increase your fluid intake. . If you experience trouble breathing, this can be serious. If it is severe call 911 IMMEDIATELY. If it is mild, please call our office.    Follow-Up: We will call you with the results of your tests.      Hilbert Corrigan, [place]  09/20/2017 1:23 PM    [facility] [place], [place], [place]  [address] Phone: [phone]; Fax: [phone]

## 2017-09-19 NOTE — Patient Instructions (Addendum)
Medication Instructions: Your physician recommends that you continue on your current medications as directed. Please refer to the Current Medication list given to you today.  Labwork: Your physician recommends that you return for lab work today: STAT Troponin  Your physician recommends that you have lab work at your PCP office NEXT MONDAY--CBC, TSH. Please have the results faxed to [phone]--ATTN: Almyra Deforest, PA.    Testing/Procedures:  Please arrive at the [facility] main entrance of [facility] at                            AM (30-45 minutes prior to test start time)  [facility] [place], [place] [address] [phone]  Proceed to the [facility] Radiology Department (First Floor).  Please follow these instructions carefully (unless otherwise directed):  Labwork prior to test--within 1 week--BMET, to check kidney function due to administration of IV contrast dye.  On the Night Before the Test: . Drink plenty of water. . Do not consume any caffeinated/decaffeinated beverages or chocolate 12 hours prior to your test. . Do not take any antihistamines 12 hours prior to your test.  On the Day of the Test: . Drink plenty of water. Do not drink any water within one hour of the test. . Do not eat any food 4 hours prior to the test. . You may take your regular medications prior to the test. . IF NOT ON A BETA BLOCKER - Take 25 mg of lopressor (metoprolol) one hour before the test.  After the Test: . Drink plenty of water. . After receiving IV contrast, you may experience a mild flushed feeling. This is normal. . On occasion, you may experience a mild rash up to 24 hours after the test. This is not dangerous. If this occurs, you can take Benadryl 25 mg and increase your fluid intake. . If you experience trouble breathing, this can be serious. If it is severe call 911 IMMEDIATELY. If it is mild, please call our office.    Follow-Up: We will  call you with the results of your tests.

## 2017-09-20 ENCOUNTER — Telehealth: Payer: Self-pay | Admitting: Internal Medicine

## 2017-09-20 ENCOUNTER — Encounter: Payer: Self-pay | Admitting: Physician Assistant

## 2017-09-20 ENCOUNTER — Telehealth: Payer: Self-pay | Admitting: *Deleted

## 2017-09-20 LAB — TROPONIN I: Troponin I: <0.01 ng/mL (ref 0.00–0.04)

## 2017-09-20 NOTE — Progress Notes (Signed)
Troponin negative, result reassuring, no change to plan discussed during office visit

## 2017-09-20 NOTE — Telephone Encounter (Signed)
Patient made aware of results and verbalized her understanding.    Notes recorded by Almyra Deforest, PA on 09/20/2017 at 10:07 AM EST Troponin negative, result reassuring, no change to plan discussed during office visit

## 2017-09-20 NOTE — Telephone Encounter (Signed)
Mrs. Revels is calling to get her lab results . Please call

## 2017-09-24 NOTE — Telephone Encounter (Signed)
Called patient to discuss lab results, no answer left message for her to contact the office.

## 2017-10-23 LAB — CBC
Hematocrit: 42.6 % (ref 34.0–46.6)
Hemoglobin: 13.7 g/dL (ref 11.1–15.9)
MCH: 32.1 pg (ref 26.6–33.0)
MCHC: 32.2 g/dL (ref 31.5–35.7)
MCV: 100 fL — ABNORMAL HIGH (ref 79–97)
PLATELETS: 307 10*3/uL (ref 150–379)
RBC: 4.27 x10E6/uL (ref 3.77–5.28)
RDW: 13.8 % (ref 12.3–15.4)
WBC: 8.1 10*3/uL (ref 3.4–10.8)

## 2017-10-23 LAB — BASIC METABOLIC PANEL
BUN / CREAT RATIO: 14 (ref 9–23)
BUN: 8 mg/dL (ref 6–24)
CHLORIDE: 103 mmol/L (ref 96–106)
CO2: 25 mmol/L (ref 20–29)
Calcium: 9.6 mg/dL (ref 8.7–10.2)
Creatinine, Ser: 0.57 mg/dL (ref 0.57–1.00)
GFR calc non Af Amer: 111 mL/min/{1.73_m2} (ref >59)
GFR, EST AFRICAN AMERICAN: 128 mL/min/{1.73_m2} (ref >59)
GLUCOSE: 88 mg/dL (ref 65–99)
Potassium: 4.1 mmol/L (ref 3.5–5.2)
SODIUM: 143 mmol/L (ref 134–144)

## 2017-10-23 LAB — TSH: TSH: 0.885 u[IU]/mL (ref 0.450–4.500)

## 2017-10-25 ENCOUNTER — Ambulatory Visit (HOSPITAL_COMMUNITY)
Admission: RE | Admit: 2017-10-25 | Discharge: 2017-10-25 | Disposition: A | Discharge status: Home / Self Care | Payer: BLUE CROSS/BLUE SHIELD | Source: Ambulatory Visit | Attending: Physician Assistant | Admitting: Physician Assistant

## 2017-10-25 ENCOUNTER — Ambulatory Visit (HOSPITAL_COMMUNITY): Payer: BLUE CROSS/BLUE SHIELD

## 2017-10-25 DIAGNOSIS — R072 Precordial pain: Secondary | ICD-10-CM | POA: Diagnosis not present

## 2017-10-25 DIAGNOSIS — R079 Chest pain, unspecified: Secondary | ICD-10-CM | POA: Diagnosis not present

## 2017-10-25 MED ORDER — IOPAMIDOL (ISOVUE-370) INJECTION 76%
INTRAVENOUS | Status: AC
  Administered 2017-10-25: 80 mL
  Filled 2017-10-25: qty 100

## 2017-10-25 MED ORDER — NITROGLYCERIN 0.4 MG SL SUBL
SUBLINGUAL_TABLET | SUBLINGUAL | Status: AC
  Administered 2017-10-25: 0.8 mg via SUBLINGUAL
  Filled 2017-10-25: qty 2

## 2017-10-25 MED ORDER — NITROGLYCERIN 0.4 MG SL SUBL
0.8000 mg | SUBLINGUAL_TABLET | Freq: Once | SUBLINGUAL | Status: AC
  Administered 2017-10-25: 0.8 mg via SUBLINGUAL
  Filled 2017-10-25: qty 25

## 2019-05-26 ENCOUNTER — Encounter (INDEPENDENT_AMBULATORY_CARE_PROVIDER_SITE_OTHER): Payer: Self-pay

## 2019-11-13 ENCOUNTER — Encounter: Payer: Self-pay | Admitting: General Practice

## 2020-03-09 ENCOUNTER — Encounter: Payer: Self-pay | Admitting: Genetic Counselor

## 2020-04-26 ENCOUNTER — Other Ambulatory Visit: Payer: Self-pay

## 2020-04-26 ENCOUNTER — Encounter (INDEPENDENT_AMBULATORY_CARE_PROVIDER_SITE_OTHER): Payer: Self-pay | Admitting: Otolaryngology

## 2020-04-26 ENCOUNTER — Ambulatory Visit (INDEPENDENT_AMBULATORY_CARE_PROVIDER_SITE_OTHER): Payer: BLUE CROSS/BLUE SHIELD | Admitting: Otolaryngology

## 2020-04-26 VITALS — Temp 97.3°F

## 2020-04-26 DIAGNOSIS — H66002 Acute suppurative otitis media without spontaneous rupture of ear drum, left ear: Secondary | ICD-10-CM | POA: Diagnosis not present

## 2020-04-26 NOTE — Progress Notes (Signed)
HPI: Lori Underwood is a 49 y.o. female who returns today for evaluation of the left ear which has been draining for over a week.  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  She does not fly now and wants to see about having the tube removed..  Past Medical History:  Diagnosis Date  . Back pain   . Cancer ([place])   . Diverticulitis   . Endometriosis 2009  . Generalized anxiety disorder   . IBS (irritable bowel syndrome)    mild  . Sleep apnea    Past Surgical History:  Procedure Laterality Date  . ABDOMINAL HYSTERECTOMY  2011, 2012  . BREAST LUMPECTOMY    . CESAREAN SECTION  1996  . MYRINGOTOMY WITH TUBE PLACEMENT Left 08/29/2015  . OVARIAN CYST REMOVAL    . WISDOM TOOTH EXTRACTION  1980s   Social History   Socioeconomic History  . Marital status: Divorced    Spouse name: Not on file  . Number of children: 2  . Years of education: 17  . Highest education level: Not on file  Occupational History  . Occupation: Artist: [employer]  Tobacco Use  . Smoking status: Current Every Day Smoker    Packs/day: 0.50    Years: 31.00    Pack years: 15.50    Types: Cigarettes    Start date: 51  . Smokeless tobacco: Never Used  Substance and Sexual Activity  . Alcohol use: Yes    Alcohol/week: 3.0 standard drinks    Types: 3 Standard drinks or equivalent per week  . Drug use: No  . Sexual activity: Not on file  Other Topics Concern  . Not on file  Social History Narrative   epworth sleepiness scale = 3 (09/13/2015)   Social Determinants of Health   Financial Resource Strain:   . Difficulty of Paying Living Expenses: Not on file  Food Insecurity:   . Worried About Charity fundraiser in the Last Year: Not on file  . Ran Out of Food in the Last Year: Not on file  Transportation Needs:   . Lack of Transportation (Medical): Not on  file  . Lack of Transportation (Non-Medical): Not on file  Physical Activity:   . Days of Exercise per Week: Not on file  . Minutes of Exercise per Session: Not on file  Stress:   . Feeling of Stress : Not on file  Social Connections:   . Frequency of Communication with Friends and Family: Not on file  . Frequency of Social Gatherings with Friends and Family: Not on file  . Attends Religious Services: Not on file  . Active Member of Clubs or Organizations: Not on file  . Attends Archivist Meetings: Not on file  . Marital Status: Not on file   Family History  Problem Relation Age of Onset  . Breast cancer Mother   . Anuerysm Father        heart  . Lung cancer Maternal Grandmother   . Heart attack Maternal Grandfather   . Anxiety disorder Brother   . Depression Brother   . Anxiety disorder Son   . Irritable bowel syndrome Son    Allergies  Allergen Reactions  . Erythromycin Nausea And Vomiting   Prior to Admission medications   Medication Sig Start  Date End Date Taking? Authorizing Provider  acetaminophen (TYLENOL) 325 MG tablet Take 325 mg by mouth daily as needed for mild pain or moderate pain.   Yes [provider]  ALPRAZolam Duanne Moron) 1 MG tablet Take 1 mg by mouth at bedtime as needed.     Yes [provider]  cholecalciferol (VITAMIN D) 1000 units tablet Take 1,000 Units by mouth 2 (two) times daily.   Yes [provider]  Cyanocobalamin (B-12 COMPLIANCE INJECTION IJ) Inject as directed every 30 (thirty) days.   Yes [provider]  magnesium citrate SOLN Take 1 Bottle by mouth every 30 (thirty) days.   Yes [provider]  metoprolol tartrate (LOPRESSOR) 25 MG tablet Take 1 tablet (25 mg total) by mouth 1 day or 1 dose. 1 hour prior to CTA 09/19/17  Yes [place], [place], [place]     Positive ROS: Otherwise negative  All other systems have been reviewed and were otherwise negative with the exception of those mentioned in the  HPI and as above.  Physical Exam: Constitutional: Alert, well-appearing, no acute distress Ears: External ears without lesions or tenderness.  Right ear canal was clear with a clear right TM.  Left ear canal revealed purulent discharge and a small amount of granulation tissue around the T-tube.  This was cleaned and suctioned and the T-tube was removed.  She had a granulation tissue around the small perforation and I applied Ciprodex eardrops and CSF powder to the left ear canal and TM which were insufflated into the middle ear space. Nasal: External nose without lesions. Clear nasal passages Oral: Lips and gums without lesions. Tongue and palate mucosa without lesions. Posterior oropharynx clear. Neck: No palpable adenopathy or masses Respiratory: Breathing comfortably  Skin: No facial/neck lesions or rash noted.  Binocular microscopy  Date/Time: 04/26/2020 5:56 PM Performed by: Rozetta Nunnery, MD Authorized by: Rozetta Nunnery, MD   Consent:    Consent obtained:  Verbal   Consent given by:  Patient   Alternatives discussed:  No treatment Procedure details:    Indications: otitis media and tympanostomy tube follow-up     Scope location: bilateral     Right speculum size:  3 Cerumen:    Right ear: normal quantity of cerumen     Left ear: normal quantity of cerumen   Right ear canal:    normal   Left ear canal:    normal   Left tympanic membrane:    Perforation: central perforation   Post-procedure details:    Patient tolerance of procedure:  Tolerated well, no immediate complications Comments:     On microscopic exam patient has drainage from the left T-tube which was removed with a small central perforation.  I applied Ciprodex eardrops to the left ear.  As well as CSF powder.    Assessment: Acute left otitis media and patient with a T-tube.  Plan: T-tube was removed in the office today and I applied Ciprodex and CSF powder to the left ear. Prescribed  ciprofloxacin and dexamethasone suspension eardrops 4 to 5 drops in the left ear twice daily for 5 days if the drainage does not stop within 48 hours. Discussed with her that it will take approximately 2 to 3 weeks for the perforation to heal.   Radene Journey, MD

## 2021-08-28 ENCOUNTER — Encounter (HOSPITAL_COMMUNITY): Payer: Self-pay | Admitting: *Deleted

## 2021-08-28 ENCOUNTER — Emergency Department (HOSPITAL_COMMUNITY): Payer: BLUE CROSS/BLUE SHIELD

## 2021-08-28 ENCOUNTER — Other Ambulatory Visit: Payer: Self-pay

## 2021-08-28 ENCOUNTER — Emergency Department (HOSPITAL_COMMUNITY)
Admission: EM | Admit: 2021-08-28 | Discharge: 2021-08-29 | Disposition: A | Discharge status: Home / Self Care | Payer: BLUE CROSS/BLUE SHIELD | Source: Home / Self Care | Attending: Emergency Medicine | Admitting: Emergency Medicine

## 2021-08-28 DIAGNOSIS — F332 Major depressive disorder, recurrent severe without psychotic features: Secondary | ICD-10-CM | POA: Insufficient documentation

## 2021-08-28 DIAGNOSIS — Z79899 Other long term (current) drug therapy: Secondary | ICD-10-CM | POA: Insufficient documentation

## 2021-08-28 DIAGNOSIS — Y900 Blood alcohol level of less than 20 mg/100 ml: Secondary | ICD-10-CM | POA: Insufficient documentation

## 2021-08-28 DIAGNOSIS — F419 Anxiety disorder, unspecified: Secondary | ICD-10-CM | POA: Insufficient documentation

## 2021-08-28 DIAGNOSIS — Z20822 Contact with and (suspected) exposure to covid-19: Secondary | ICD-10-CM | POA: Insufficient documentation

## 2021-08-28 DIAGNOSIS — R45851 Suicidal ideations: Secondary | ICD-10-CM | POA: Insufficient documentation

## 2021-08-28 DIAGNOSIS — F4310 Post-traumatic stress disorder, unspecified: Secondary | ICD-10-CM | POA: Insufficient documentation

## 2021-08-28 DIAGNOSIS — R079 Chest pain, unspecified: Secondary | ICD-10-CM

## 2021-08-28 HISTORY — DX: Post-traumatic stress disorder, unspecified: F43.10

## 2021-08-28 LAB — CBC
HCT: 43.5 % (ref 36.0–46.0)
Hemoglobin: 14.3 g/dL (ref 12.0–15.0)
MCH: 32.6 pg (ref 26.0–34.0)
MCHC: 32.9 g/dL (ref 30.0–36.0)
MCV: 99.1 fL (ref 80.0–100.0)
Platelets: 240 10*3/uL (ref 150–400)
RBC: 4.39 MIL/uL (ref 3.87–5.11)
RDW: 12.7 % (ref 11.5–15.5)
WBC: 9.3 10*3/uL (ref 4.0–10.5)
nRBC: 0 % (ref 0.0–0.2)

## 2021-08-28 LAB — COMPREHENSIVE METABOLIC PANEL
ALT: 14 U/L (ref 0–44)
AST: 14 U/L — ABNORMAL LOW (ref 15–41)
Albumin: 4.3 g/dL (ref 3.5–5.0)
Alkaline Phosphatase: 57 U/L (ref 38–126)
Anion gap: 8 (ref 5–15)
BUN: 11 mg/dL (ref 6–20)
CO2: 26 mmol/L (ref 22–32)
Calcium: 9.1 mg/dL (ref 8.9–10.3)
Chloride: 103 mmol/L (ref 98–111)
Creatinine, Ser: 0.56 mg/dL (ref 0.44–1.00)
GFR, Estimated: >60 mL/min (ref >60)
Glucose, Bld: 97 mg/dL (ref 70–99)
Potassium: 3.4 mmol/L — ABNORMAL LOW (ref 3.5–5.1)
Sodium: 137 mmol/L (ref 135–145)
Total Bilirubin: 0.6 mg/dL (ref 0.3–1.2)
Total Protein: 7.4 g/dL (ref 6.5–8.1)

## 2021-08-28 LAB — SALICYLATE LEVEL: Salicylate Lvl: <7 mg/dL — ABNORMAL LOW (ref 7.0–30.0)

## 2021-08-28 LAB — ETHANOL: Alcohol, Ethyl (B): <10 mg/dL (ref <10)

## 2021-08-28 LAB — TROPONIN I (HIGH SENSITIVITY): Troponin I (High Sensitivity): 4 ng/L (ref <18)

## 2021-08-28 LAB — RAPID URINE DRUG SCREEN, HOSP PERFORMED
Amphetamines: POSITIVE — AB
Barbiturates: NOT DETECTED
Benzodiazepines: POSITIVE — AB
Cocaine: NOT DETECTED
Opiates: NOT DETECTED
Tetrahydrocannabinol: NOT DETECTED

## 2021-08-28 LAB — POC URINE PREG, ED: Preg Test, Ur: NEGATIVE

## 2021-08-28 LAB — VITAMIN B12: Vitamin B-12: 361 pg/mL (ref 180–914)

## 2021-08-28 LAB — ACETAMINOPHEN LEVEL: Acetaminophen (Tylenol), Serum: <10 ug/mL — ABNORMAL LOW (ref 10–30)

## 2021-08-28 MED ORDER — NICOTINE 21 MG/24HR TD PT24: 21.0000 mg | MEDICATED_PATCH | Freq: Every day | TRANSDERMAL | Status: DC

## 2021-08-28 MED ORDER — ACETAMINOPHEN 325 MG PO TABS: 650.0000 mg | ORAL_TABLET | ORAL | Status: DC | PRN

## 2021-08-28 NOTE — ED Provider Notes (Signed)
[facility] EMERGENCY DEPARTMENT Provider Note   CSN: [id number] Arrival date & time: 08/28/21  2034     History  No chief complaint on file.   Lori Underwood is a 51 y.o. female.  She is complaining of chest tightness and feeling anxious, feeling her PTSD.  Her son left without her being able to see him and she does not know how he is.  She said she is thinking about taking some pills because she is so tired of everything.  She is also said she has had some on and off vertigo which she has had for a while.  History of breast cancer and has a new lump and did not follow-up.   Mental Health Problem Presenting symptoms: depression and suicidal thoughts   Degree of incapacity (severity):  Unable to specify Onset quality:  Gradual Duration:  2 months Timing:  Intermittent Progression:  Worsening Chronicity:  Recurrent Context: stressful life event   Relieved by:  Nothing Ineffective treatments:  None tried Associated symptoms: chest pain   Associated symptoms: no abdominal pain and no headaches   Chest pain:    Quality: tightness     Severity:  Moderate   Onset quality:  Gradual   Duration:  2 days   Timing:  Constant   Progression:  Unchanged   Chronicity:  Recurrent     Home Medications Prior to Admission medications   Medication Sig Start Date End Date Taking? Authorizing Provider  acetaminophen (TYLENOL) 325 MG tablet Take 325 mg by mouth daily as needed for mild pain or moderate pain.    [provider]  ALPRAZolam Duanne Moron) 1 MG tablet Take 1 mg by mouth at bedtime as needed.      [provider]  cholecalciferol (VITAMIN D) 1000 units tablet Take 1,000 Units by mouth 2 (two) times daily.    [provider]  Cyanocobalamin (B-12 COMPLIANCE INJECTION IJ) Inject as directed every 30 (thirty) days.    [provider]  magnesium citrate SOLN Take 1 Bottle by mouth every 30 (thirty) days.    [provider]  metoprolol tartrate  (LOPRESSOR) 25 MG tablet Take 1 tablet (25 mg total) by mouth 1 day or 1 dose. 1 hour prior to CTA 09/19/17   Almyra Deforest, PA      Allergies    Erythromycin    Review of Systems   Review of Systems  Constitutional:  Negative for fever.  HENT:  Negative for sore throat.   Eyes:  Negative for visual disturbance.  Respiratory:  Positive for chest tightness. Negative for shortness of breath.   Cardiovascular:  Positive for chest pain.  Gastrointestinal:  Negative for abdominal pain.  Genitourinary:  Negative for dysuria.  Musculoskeletal:  Negative for neck pain.  Skin:  Negative for rash.  Neurological:  Positive for dizziness. Negative for headaches.  Psychiatric/Behavioral:  Positive for suicidal ideas.    Physical Exam Updated Vital Signs BP 112/89 (BP Location: Right Arm)    Pulse 85    Temp 97.9 F (36.6 C) (Oral)    Resp 15    SpO2 100%  Physical Exam Vitals and nursing note reviewed.  Constitutional:      General: She is not in acute distress.    Appearance: Normal appearance. She is well-developed.  HENT:     Head: Normocephalic and atraumatic.  Eyes:     Conjunctiva/sclera: Conjunctivae normal.  Cardiovascular:     Rate and Rhythm: Normal rate and regular rhythm.  Heart sounds: No murmur heard. Pulmonary:     Effort: Pulmonary effort is normal. No respiratory distress.     Breath sounds: Normal breath sounds.  Abdominal:     Palpations: Abdomen is soft.     Tenderness: There is no abdominal tenderness. There is no guarding or rebound.  Musculoskeletal:        General: No swelling.     Cervical back: Neck supple.  Skin:    General: Skin is warm and dry.     Capillary Refill: Capillary refill takes less than 2 seconds.  Neurological:     General: No focal deficit present.     Mental Status: She is alert.     Sensory: No sensory deficit.     Motor: No weakness.    ED Results / Procedures / Treatments   Labs (all labs ordered are listed, but only abnormal  results are displayed) Labs Reviewed  COMPREHENSIVE METABOLIC PANEL - Abnormal; Notable for the following components:      Result Value   Potassium 3.4 (*)    AST 14 (*)    All other components within normal limits  SALICYLATE LEVEL - Abnormal; Notable for the following components:   Salicylate Lvl <5.4 (*)    All other components within normal limits  ACETAMINOPHEN LEVEL - Abnormal; Notable for the following components:   Acetaminophen (Tylenol), Serum <10 (*)    All other components within normal limits  RAPID URINE DRUG SCREEN, HOSP PERFORMED - Abnormal; Notable for the following components:   Benzodiazepines POSITIVE (*)    Amphetamines POSITIVE (*)    All other components within normal limits  RESP PANEL BY RT-PCR (FLU A&B, COVID) ARPGX2  ETHANOL  CBC  VITAMIN B12  POC URINE PREG, ED  TROPONIN I (HIGH SENSITIVITY)  TROPONIN I (HIGH SENSITIVITY)    EKG EKG Interpretation  Date/Time:  Monday August 28 2021 21:36:37 EST Ventricular Rate:  60 PR Interval:  122 QRS Duration: 82 QT Interval:  440 QTC Calculation: 440 R Axis:   66 Text Interpretation: Normal sinus rhythm Normal ECG When compared with ECG of 02-May-2016 14:41, No significant change since last tracing Confirmed by Aletta Edouard [phone]) on 08/28/2021 9:40:24 PM  Radiology DG Chest 1 View  Result Date: 08/28/2021 CLINICAL DATA:  Chest tightness. EXAM: CHEST  1 VIEW COMPARISON:  Chest x-ray 10/03/2016. FINDINGS: The heart size and mediastinal contours are within normal limits. Both lungs are clear. The visualized skeletal structures are unremarkable. IMPRESSION: No active disease. Electronically Signed   By: Ronney Asters M.D.   On: 08/28/2021 22:13    Procedures Procedures    Medications Ordered in ED Medications  acetaminophen (TYLENOL) tablet 650 mg (has no administration in time range)  nicotine (NICODERM CQ - dosed in mg/24 hours) patch 21 mg (has no administration in time range)    ED Course/  Medical Decision Making/ A&P Clinical Course as of 08/29/21 0850  Mon Aug 28, 2021  2240 Currently voluntary at this time. [MB]    Clinical Course User Index [MB] Hayden Rasmussen, MD                           Medical Decision Making Amount and/or Complexity of Data Reviewed Labs: ordered. Radiology: ordered.  Risk OTC drugs.  This patient complains of increased anxiety depression suicidal ideation chest tightness vertigo; this involves an extensive number of treatment Options and is a complaint that carries with it a  high risk of complications and Morbidity. The differential includes mental health crisis, ACS, pneumonia, PE, pneumothorax, metabolic derangement, intoxication  I ordered, reviewed and interpreted labs, which included CBC with normal white count, chemistries with mildly low potassium otherwise unremarkable, alcohol negative, troponins negative, pregnancy test negative, COVID and flu negative I ordered medication, normal as needed I ordered imaging studies which included chest x-ray and I independently    visualized and interpreted imaging which showed no acute findings Previous records obtained and reviewed in no recent admissions I consulted TTS and discussed lab and imaging findings  Critical Interventions: None  After the interventions stated above, I reevaluated the patient and found patient to be voluntary and concern for suicidal ideation.  She needs a behavioral health consult.  Patient is medically cleared for psychiatric evaluation.          Final Clinical Impression(s) / ED Diagnoses Final diagnoses:  Nonspecific chest pain  Anxiety  Suicidal ideation    Rx / DC Orders ED Discharge Orders     None         Hayden Rasmussen, MD 08/29/21 [phone]

## 2021-08-28 NOTE — BH Assessment (Signed)
Clinician reviewed pt's chart and information in preparation to complete pt's [place] Assessment. However, there is currently no documentation from pt's EDP. TTS to complete pt's MH Assessment at a later time after she has been seen by EDP and medically cleared.

## 2021-08-28 NOTE — ED Notes (Signed)
Triage note -  EMS reports that pt had a handful differnet pills but pt not able to state what they were.  EMS states no pill bottles were found at scene.  Pt admits to taking a xanax (? Mg) seneral hours ago. Reported that pt states her son is committed in [place] and was reported that the sheriff dept is looking into it per EMS.  Pt states she has PTSD. When asked about her plan for SI, pt responds "i'm just tired and stressed" "i'm gonna take some pills"

## 2021-08-29 ENCOUNTER — Ambulatory Visit (HOSPITAL_COMMUNITY)
Admission: EM | Admit: 2021-08-29 | Discharge: 2021-08-30 | Disposition: A | Discharge status: Other Acute Inpatient Hospital | Payer: BLUE CROSS/BLUE SHIELD | Attending: Psychiatry | Admitting: Psychiatry

## 2021-08-29 ENCOUNTER — Encounter (HOSPITAL_COMMUNITY): Payer: Self-pay | Admitting: Emergency Medicine

## 2021-08-29 DIAGNOSIS — F332 Major depressive disorder, recurrent severe without psychotic features: Secondary | ICD-10-CM | POA: Diagnosis not present

## 2021-08-29 LAB — RESP PANEL BY RT-PCR (FLU A&B, COVID) ARPGX2
Influenza A by PCR: NEGATIVE
Influenza B by PCR: NEGATIVE
SARS Coronavirus 2 by RT PCR: NEGATIVE

## 2021-08-29 LAB — TROPONIN I (HIGH SENSITIVITY): Troponin I (High Sensitivity): 4 ng/L (ref <18)

## 2021-08-29 MED ORDER — ADULT MULTIVITAMIN W/MINERALS CH
1.0000 | ORAL_TABLET | Freq: Every day | ORAL | Status: DC
  Administered 2021-08-29 – 2021-08-30 (×2): 1 via ORAL
  Filled 2021-08-29 (×2): qty 1

## 2021-08-29 MED ORDER — HYDROXYZINE HCL 25 MG PO TABS: 25.0000 mg | ORAL_TABLET | Freq: Three times a day (TID) | ORAL | Status: DC | PRN

## 2021-08-29 MED ORDER — THIAMINE HCL 100 MG PO TABS
100.0000 mg | ORAL_TABLET | Freq: Every day | ORAL | Status: DC
  Administered 2021-08-30: 100 mg via ORAL
  Filled 2021-08-29: qty 1

## 2021-08-29 MED ORDER — LORAZEPAM 1 MG PO TABS: 1.0000 mg | ORAL_TABLET | Freq: Four times a day (QID) | ORAL | Status: DC | PRN

## 2021-08-29 MED ORDER — LOPERAMIDE HCL 2 MG PO CAPS: 2.0000 mg | ORAL_CAPSULE | ORAL | Status: DC | PRN

## 2021-08-29 MED ORDER — LORAZEPAM 1 MG PO TABS
1.0000 mg | ORAL_TABLET | Freq: Once | ORAL | Status: AC
  Administered 2021-08-29: 1 mg via ORAL
  Filled 2021-08-29: qty 1

## 2021-08-29 MED ORDER — ONDANSETRON 4 MG PO TBDP: 4.0000 mg | ORAL_TABLET | Freq: Four times a day (QID) | ORAL | Status: DC | PRN

## 2021-08-29 MED ORDER — ALUM & MAG HYDROXIDE-SIMETH 200-200-20 MG/5ML PO SUSP: 30.0000 mL | ORAL | Status: DC | PRN

## 2021-08-29 MED ORDER — ACETAMINOPHEN 325 MG PO TABS: 650.0000 mg | ORAL_TABLET | Freq: Four times a day (QID) | ORAL | Status: DC | PRN

## 2021-08-29 MED ORDER — TRAZODONE HCL 50 MG PO TABS
50.0000 mg | ORAL_TABLET | Freq: Every evening | ORAL | Status: DC | PRN
  Administered 2021-08-29: 50 mg via ORAL
  Filled 2021-08-29: qty 1

## 2021-08-29 MED ORDER — CYCLOBENZAPRINE HCL 10 MG PO TABS: 5.0000 mg | ORAL_TABLET | Freq: Three times a day (TID) | ORAL | Status: DC | PRN

## 2021-08-29 MED ORDER — MAGNESIUM HYDROXIDE 400 MG/5ML PO SUSP: 30.0000 mL | Freq: Every day | ORAL | Status: DC | PRN
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
## 2021-08-29 NOTE — ED Notes (Signed)
Pt ready for transfer to [facility] facility, belongings reviewed with patient to reassure all belongings would be transferred with her.   Voluntary consent signed and faxed to facility.

## 2021-08-29 NOTE — Progress Notes (Signed)
Patient admitted to the [facility] for safety due to self harm thoughts and behaviors. Patient is disoriented during assessment, with poor eyes contact, flat facial expression, very depressed affect and speech slurred and slow. Patient given orientation to unit and expressed she would like to sleep. Staff will monitor for safety.

## 2021-08-29 NOTE — ED Provider Notes (Signed)
Emergency Medicine Observation Re-evaluation Note  Lori Underwood is a 51 y.o. female, seen on rounds today.  Pt initially presented to the ED for complaints of V70.1 Currently, the patient is awake and alert, resting in bed.  Physical Exam  BP 112/89 (BP Location: Right Arm)    Pulse 85    Temp 97.9 F (36.6 C) (Oral)    Resp 15    SpO2 100%  Physical Exam General: Nondistressed Cardiac: Extremities well perfused Lungs: Breathing is even and unlabored Psych: No agitation, not responding to internal stimuli  ED Course / MDM  EKG:EKG Interpretation  Date/Time:  Monday August 28 2021 21:36:37 EST Ventricular Rate:  60 PR Interval:  122 QRS Duration: 82 QT Interval:  440 QTC Calculation: 440 R Axis:   66 Text Interpretation: Normal sinus rhythm Normal ECG When compared with ECG of 02-May-2016 14:41, No significant change since last tracing Confirmed by Aletta Edouard [phone]) on 08/28/2021 9:40:24 PM  I have reviewed the labs performed to date as well as medications administered while in observation.  Recent changes in the last 24 hours include patient presented last night after an intentional ingestion.  She reportedly took a handful of different pills but unable to state what those pills were.  She does admit to Xanax use.  She continues to endorse suicidal ideation with plan of intentional overdose on arrival in the ED.  She was evaluated by TTS and plan is for psychiatric admission.  She has been accepted at facility based crisis at [facility].  She is going there voluntarily.  Plan  Current plan is for transfer to [facility].  Lori Underwood is not under involuntary commitment.     Godfrey Pick, MD 08/29/21 [phone]

## 2021-08-29 NOTE — ED Notes (Signed)
Pt A&O x 4, resting at present, no distress noted.  Monitoring for safety.  Respirations even & Unlabored.

## 2021-08-29 NOTE — ED Notes (Signed)
Snacks given 

## 2021-08-29 NOTE — BH Assessment (Signed)
Pt is a resident of [place] and [place] is currently at capacity.  Per Cornelia Copa, [facility] at [facility], Ms Staebell has been accepted to Facility-Based Crisis [facility]) at [facility]. She can come after 0700. The accepting physician is Dr Serafina Mitchell. Number for RN report is [phone].  Notified [name], RN of acceptance and she informed Dr Dayna Barker.   Evelena Peat, [facility], [facility] Triage Specialist [phone]

## 2021-08-29 NOTE — ED Notes (Signed)
Facility-Based Crisis [facility]) at [facility]. She can come after 0700. The accepting physician is Dr Serafina Mitchell. Number for RN report is [phone].

## 2021-08-29 NOTE — ED Notes (Signed)
Was given dinner(turkey sandwich and juice)

## 2021-08-29 NOTE — ED Notes (Signed)
Covid swab collected and taken to lab. Updated patient on plan. Pt verbalized agreement for transfer.

## 2021-08-29 NOTE — ED Notes (Signed)
Pt is moving around

## 2021-08-29 NOTE — ED Notes (Signed)
Gave report to Facility Based Crisis
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
## 2021-08-29 NOTE — ED Notes (Signed)
Pt resting at present, no distress noted, calm & cooperative at present.  Pt reports she feels better.  Monitoring for safety.

## 2021-08-29 NOTE — ED Notes (Signed)
Pt was given hot lunch. While eating, Pt continues to stare at ID band at times

## 2021-08-29 NOTE — ED Notes (Signed)
Safe transport to be here in 30 in to get patient.

## 2021-08-30 ENCOUNTER — Encounter (HOSPITAL_COMMUNITY): Payer: Self-pay | Admitting: Registered Nurse

## 2021-08-30 ENCOUNTER — Other Ambulatory Visit: Payer: Self-pay | Admitting: Registered Nurse

## 2021-08-30 ENCOUNTER — Inpatient Hospital Stay (HOSPITAL_COMMUNITY)
Admission: AD | Admit: 2021-08-30 | Discharge: 2021-09-04 | DRG: 885 | Disposition: A | Discharge status: Home / Self Care | Payer: BLUE CROSS/BLUE SHIELD | Source: Intra-hospital | Attending: Emergency Medicine | Admitting: Emergency Medicine

## 2021-08-30 DIAGNOSIS — F411 Generalized anxiety disorder: Secondary | ICD-10-CM | POA: Diagnosis present

## 2021-08-30 DIAGNOSIS — Z91410 Personal history of adult physical and sexual abuse: Secondary | ICD-10-CM

## 2021-08-30 DIAGNOSIS — F909 Attention-deficit hyperactivity disorder, unspecified type: Secondary | ICD-10-CM | POA: Diagnosis present

## 2021-08-30 DIAGNOSIS — Z20822 Contact with and (suspected) exposure to covid-19: Secondary | ICD-10-CM | POA: Diagnosis present

## 2021-08-30 DIAGNOSIS — Z803 Family history of malignant neoplasm of breast: Secondary | ICD-10-CM | POA: Diagnosis not present

## 2021-08-30 DIAGNOSIS — G4730 Sleep apnea, unspecified: Secondary | ICD-10-CM | POA: Diagnosis present

## 2021-08-30 DIAGNOSIS — Z90710 Acquired absence of both cervix and uterus: Secondary | ICD-10-CM | POA: Diagnosis not present

## 2021-08-30 DIAGNOSIS — Z881 Allergy status to other antibiotic agents status: Secondary | ICD-10-CM | POA: Diagnosis not present

## 2021-08-30 DIAGNOSIS — Z818 Family history of other mental and behavioral disorders: Secondary | ICD-10-CM

## 2021-08-30 DIAGNOSIS — R45851 Suicidal ideations: Secondary | ICD-10-CM | POA: Diagnosis present

## 2021-08-30 DIAGNOSIS — E876 Hypokalemia: Secondary | ICD-10-CM | POA: Diagnosis present

## 2021-08-30 DIAGNOSIS — Z888 Allergy status to other drugs, medicaments and biological substances status: Secondary | ICD-10-CM

## 2021-08-30 DIAGNOSIS — F332 Major depressive disorder, recurrent severe without psychotic features: Secondary | ICD-10-CM | POA: Diagnosis present

## 2021-08-30 DIAGNOSIS — F4310 Post-traumatic stress disorder, unspecified: Secondary | ICD-10-CM | POA: Diagnosis present

## 2021-08-30 DIAGNOSIS — Z853 Personal history of malignant neoplasm of breast: Secondary | ICD-10-CM

## 2021-08-30 DIAGNOSIS — Z62810 Personal history of physical and sexual abuse in childhood: Secondary | ICD-10-CM | POA: Diagnosis present

## 2021-08-30 DIAGNOSIS — Z79899 Other long term (current) drug therapy: Secondary | ICD-10-CM

## 2021-08-30 DIAGNOSIS — F17210 Nicotine dependence, cigarettes, uncomplicated: Secondary | ICD-10-CM | POA: Diagnosis present

## 2021-08-30 DIAGNOSIS — K589 Irritable bowel syndrome without diarrhea: Secondary | ICD-10-CM | POA: Diagnosis present

## 2021-08-30 MED ORDER — HYDROXYZINE HCL 25 MG PO TABS: 25.0000 mg | ORAL_TABLET | Freq: Four times a day (QID) | ORAL | Status: AC | PRN

## 2021-08-30 MED ORDER — ALUM & MAG HYDROXIDE-SIMETH 200-200-20 MG/5ML PO SUSP: 30.0000 mL | ORAL | Status: DC | PRN

## 2021-08-30 MED ORDER — ONDANSETRON 4 MG PO TBDP: 4.0000 mg | ORAL_TABLET | Freq: Four times a day (QID) | ORAL | Status: AC | PRN

## 2021-08-30 MED ORDER — CHLORDIAZEPOXIDE HCL 25 MG PO CAPS
25.0000 mg | ORAL_CAPSULE | Freq: Four times a day (QID) | ORAL | Status: AC | PRN
  Administered 2021-08-31: 25 mg via ORAL
  Filled 2021-08-30: qty 1

## 2021-08-30 MED ORDER — MAGNESIUM HYDROXIDE 400 MG/5ML PO SUSP: 30.0000 mL | Freq: Every day | ORAL | Status: DC | PRN

## 2021-08-30 MED ORDER — TRAZODONE HCL 50 MG PO TABS
50.0000 mg | ORAL_TABLET | Freq: Every evening | ORAL | Status: DC | PRN
  Administered 2021-08-31: 01:00:00 50 mg via ORAL
  Filled 2021-08-30 (×2): qty 1

## 2021-08-30 MED ORDER — ADULT MULTIVITAMIN W/MINERALS CH
1.0000 | ORAL_TABLET | Freq: Every day | ORAL | Status: DC
  Administered 2021-08-31 – 2021-09-04 (×5): 1 via ORAL
  Filled 2021-08-30 (×8): qty 1

## 2021-08-30 MED ORDER — LOPERAMIDE HCL 2 MG PO CAPS: 2.0000 mg | ORAL_CAPSULE | ORAL | Status: AC | PRN

## 2021-08-30 MED ORDER — ACETAMINOPHEN 325 MG PO TABS: 650.0000 mg | ORAL_TABLET | Freq: Four times a day (QID) | ORAL | Status: DC | PRN

## 2021-08-30 NOTE — ED Notes (Signed)
Pt sleeping at present, no distress noted,  Respirations even & unlabored.  Monitoring for safety.

## 2021-08-30 NOTE — Progress Notes (Addendum)
Pt will now arrive at 11pm  Pt was accepted to [facility] 08/30/2021 At 2100; Bed Assignment 406-1.   DX MDD.   Pt meets inpatient criteria per Earleen Newport, NP  Attending Physician will be MD Amy SIngleton  Report can be called to: -Adult unit: [phone]  Pt can arrive after 2100  Care Team notified via secure chat: Soyla Murphy, RN,  Earleen Newport, NP, [place] Reader, RN.  Pt must sign vol consent and fax that back to [phone].  Nadara Mode, [place] 08/30/2021 @ 6:17 PM

## 2021-08-30 NOTE — ED Notes (Signed)
Pt resting in no acute distress. Informed pt that she would be transferred to [facility] tonight. Verbalized understanding and agreement. No cute distress noted. Safety maintained.

## 2021-08-30 NOTE — ED Notes (Signed)
Received patient this PM. Patient is sitting up in bed quietly looking around. Patient respirations are even and unlabored. Will continue to monitor for safety.

## 2021-08-30 NOTE — ED Notes (Signed)
GIVEN DINNER

## 2021-08-30 NOTE — ED Notes (Signed)
Pt sitting quietly, eating breakfast.

## 2021-08-30 NOTE — Discharge Instructions (Addendum)
Transfer to [facility]

## 2021-08-30 NOTE — ED Notes (Signed)
Pt sleeping at present, no distress noted.  Monitoring for safety. 

## 2021-08-30 NOTE — Progress Notes (Incomplete Revision)
Pt will now arrive at 11pm  Pt was accepted to [facility] 08/30/2021 At 2100; Bed Assignment 406-1.   DX MDD.   Pt meets inpatient criteria per Earleen Newport, NP  Attending Physician will be MD Amy SIngleton  Report can be called to: -Adult unit: [phone]  Pt can arrive after 2100  Care Team notified via secure chat: Soyla Murphy, RN,  Earleen Newport, NP, [place] Reader, RN.  Pt must sign vol consent and fax that back to [phone].  Nadara Mode, [place] 08/30/2021 @ 6:17 PM

## 2021-08-30 NOTE — ED Notes (Signed)
Pt sleeping in no acute distress. RR even and unlabored. Safety maintained. 

## 2021-08-30 NOTE — ED Notes (Signed)
Report called to [facility] @2120 . Safe transport called 2150.
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
## 2021-08-30 NOTE — ED Notes (Signed)
Pt denies SI/HI/AVH. Calm, cooperative with staff. Denies withdrawal sx at current from benzo's/ETOH. Pt states, "My tremors will start around this evening I'm sure. They usually do". Informed pt to notify staff with any discomfort or thoughts to harm self or others. Verbalized understanding and agreement. Will continue to monitor for safety.

## 2021-08-31 ENCOUNTER — Other Ambulatory Visit: Payer: Self-pay

## 2021-08-31 ENCOUNTER — Encounter (HOSPITAL_COMMUNITY): Payer: Self-pay | Admitting: Emergency Medicine

## 2021-08-31 MED ORDER — MIRTAZAPINE 7.5 MG PO TABS
7.5000 mg | ORAL_TABLET | Freq: Every day | ORAL | Status: DC
  Administered 2021-08-31: 7.5 mg via ORAL
  Filled 2021-08-31 (×4): qty 1

## 2021-08-31 MED ORDER — ENSURE ENLIVE PO LIQD
237.0000 mL | Freq: Two times a day (BID) | ORAL | Status: DC
  Administered 2021-08-31 – 2021-09-04 (×8): 237 mL via ORAL
  Filled 2021-08-31 (×13): qty 237

## 2021-08-31 MED ORDER — CHLORDIAZEPOXIDE HCL 25 MG PO CAPS
25.0000 mg | ORAL_CAPSULE | Freq: Four times a day (QID) | ORAL | Status: AC
  Administered 2021-08-31 – 2021-09-01 (×6): 25 mg via ORAL
  Filled 2021-08-31 (×6): qty 1

## 2021-08-31 MED ORDER — CHLORDIAZEPOXIDE HCL 25 MG PO CAPS
25.0000 mg | ORAL_CAPSULE | Freq: Every day | ORAL | Status: AC
  Administered 2021-09-04: 25 mg via ORAL
  Filled 2021-08-31: qty 1

## 2021-08-31 MED ORDER — CHLORDIAZEPOXIDE HCL 25 MG PO CAPS
25.0000 mg | ORAL_CAPSULE | Freq: Three times a day (TID) | ORAL | Status: AC
  Administered 2021-09-02 (×3): 25 mg via ORAL
  Filled 2021-08-31 (×3): qty 1

## 2021-08-31 MED ORDER — NICOTINE 14 MG/24HR TD PT24
14.0000 mg | MEDICATED_PATCH | Freq: Every day | TRANSDERMAL | Status: DC
  Administered 2021-08-31 – 2021-09-04 (×5): 14 mg via TRANSDERMAL
  Filled 2021-08-31 (×8): qty 1

## 2021-08-31 MED ORDER — CHLORDIAZEPOXIDE HCL 25 MG PO CAPS
25.0000 mg | ORAL_CAPSULE | ORAL | Status: AC
  Administered 2021-09-03: 25 mg via ORAL
  Filled 2021-08-31 (×2): qty 1
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
## 2021-08-31 NOTE — Progress Notes (Signed)
Lori Underwood is a 51 year old female being admitted voluntarily to [facility] from [facility].  She initially presented to [facility] for depression, anxiety and suicidal ideation via EMS.  EMS reported that she was found with several pills reporting she wanted to kill herself.  During [facility] admission, she was highly irritable upon approach and would not come into the search room. She finally calmed down and complied with admission process.  She reported her current stressor is having to help take care of her adult son because of his medical problems.  She also reports decline in physical health and she fears that her breast cancer has returned.  She reported She used to weigh 186lbs but today weighs 118lbs.  She did state that she has been losing weight over the past two years but has lost 10-15 pounds in the past two week due to poor appetite.  She denied that this was an attempt and adamantly stated she did not take any pills.  "I called for help because I wasn't feeling well and confused."  She does report depression, hopelessness, helplessness, worthlessness, confusion, poor concentration and feeling detached from everyone.  She denies current SI/HI or AVH.  She did report that she has heard voices in the past when she had insomnia but that was awhile ago.  She feels like she needs some new coping skills and help finding some resources in the community for treatment and meeting people.  Oriented her to the unit.  Admission paperwork completed and signed.  Belongings searched and secured in locker # 52,  no contraband found.  Skin assessment completed and no skin issues noted.  Suicide safety plan reviewed, given to patient to complete and return to her nurse.  Pt was provided with sandwich tray.  Q 15 minute checks initiated for safety.  We will continue to monitor the progress towards her goals.

## 2021-08-31 NOTE — BHH Counselor (Signed)
CSW provided the Pt with a packet that contained information for housing/shelter resources, clothing resources, free and reduced price food resources, a [employer], and suicide prevention information.

## 2021-08-31 NOTE — BHH Group Notes (Signed)
[facility] Group Notes:  (Nursing/MHT/Case Management/Adjunct)  Date:  08/31/2021  Time:  8:52 PM  Type of Therapy:   wrap up group  Participation Level:  Active  Participation Quality:  Appropriate  Affect:  Appropriate  Cognitive:  Appropriate  Insight:  Appropriate  Engagement in Group:  Engaged  Modes of Intervention:  Discussion  Summary of Progress/Problems:  Lori Underwood 08/31/2021, 8:52 PM

## 2021-08-31 NOTE — Tx Team (Signed)
Initial Treatment Plan 08/31/2021 4:40 AM Lori Underwood FXO:[id number]    PATIENT STRESSORS: Financial difficulties   Health problems   Marital or family conflict   Other: Son's medical issues     PATIENT STRENGTHS: Active sense of humor  Capable of independent living  Communication skills  General fund of knowledge    PATIENT IDENTIFIED PROBLEMS: Depression  Suicide attempt  Poor coping    "I need some new coping skills."  "I need resources in the community for support and also resources to meet people."           DISCHARGE CRITERIA:  Improved stabilization in mood, thinking, and/or behavior Need for constant or close observation no longer present Reduction of life-threatening or endangering symptoms to within safe limits Verbal commitment to aftercare and medication compliance  PRELIMINARY DISCHARGE PLAN: Outpatient therapy Medication management  PATIENT/FAMILY INVOLVEMENT: This treatment plan has been presented to and reviewed with the patient, Lori Underwood.  The patient and family have been given the opportunity to ask questions and make suggestions.  Windell Moment, RN 08/31/2021, 4:40 AM

## 2021-08-31 NOTE — Progress Notes (Signed)
NUTRITION ASSESSMENT  Pt identified as at risk on the Malnutrition Screen Tool  INTERVENTION: 1. Supplements: Ensure Plus High Protein po BID, each supplement provides 350 kcal and 20 grams of protein.   NUTRITION DIAGNOSIS: Unintentional weight loss related to sub-optimal intake as evidenced by pt report.   Goal: Pt to meet >/= 90% of their estimated nutrition needs.  Monitor:  PO intake  Assessment:  Pt admitted with SI. Pt reports having 10-15 lbs of weight loss over the past 2 weeks. UBW was 186 lbs. Has been losing weight over the past 2 years overall.  Given large amount of weight loss, will order Ensure supplements.   Height: Ht Readings from Last 1 Encounters:  08/30/21 5\' 2"  (1.575 m)    Weight: Wt Readings from Last 1 Encounters:  08/30/21 53.5 kg    Weight Hx: Wt Readings from Last 10 Encounters:  08/30/21 53.5 kg  09/19/17 83.5 kg  03/04/17 83.5 kg  07/11/16 79.4 kg  06/29/16 81.6 kg  06/19/16 80.7 kg  05/21/16 78.7 kg  09/13/15 80.7 kg  02/05/11 79.4 kg    BMI:  Body mass index is 21.58 kg/m. Pt meets criteria for normal based on current BMI.  Estimated Nutritional Needs: Kcal: 25-30 kcal/kg Protein: > 1 gram protein/kg Fluid: 1 ml/kcal  Diet Order:  Diet Order             Diet regular Room service appropriate? Yes; Fluid consistency: Thin  Diet effective now                  Pt is also offered choice of unit snacks mid-morning and mid-afternoon.  Pt is eating as desired.   Lab results and medications reviewed.   Clayton Bibles, MS, RD, LDN Inpatient Clinical Dietitian Contact information available via Amion
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
## 2021-08-31 NOTE — Progress Notes (Signed)
°   08/31/21 0705  Sleep  Number of Hours 4

## 2021-08-31 NOTE — Progress Notes (Signed)
DAR NOTE: Patient presents with anxious affect and depressed mood.  Denies suicidal thoughts, auditory and visual hallucinations.  Described energy level as low with good concentration.  Rates depression at 7, hopelessness at 5, and anxiety at 5.  Maintained on routine safety checks.  Medications given as prescribed.  Support and encouragement offered as needed.  Attended group and participated.  States goal for today is "structure for upcoming week."  Patient visible in milieu with minimal interaction.  Offered no complaint.

## 2021-09-01 ENCOUNTER — Encounter (HOSPITAL_COMMUNITY): Payer: Self-pay

## 2021-09-01 DIAGNOSIS — F411 Generalized anxiety disorder: Secondary | ICD-10-CM | POA: Diagnosis present

## 2021-09-01 DIAGNOSIS — F4310 Post-traumatic stress disorder, unspecified: Secondary | ICD-10-CM | POA: Diagnosis present

## 2021-09-01 LAB — BASIC METABOLIC PANEL
Anion gap: 5 (ref 5–15)
BUN: 17 mg/dL (ref 6–20)
CO2: 28 mmol/L (ref 22–32)
Calcium: 8.7 mg/dL — ABNORMAL LOW (ref 8.9–10.3)
Chloride: 106 mmol/L (ref 98–111)
Creatinine, Ser: 0.55 mg/dL (ref 0.44–1.00)
GFR, Estimated: >60 mL/min (ref >60)
Glucose, Bld: 78 mg/dL (ref 70–99)
Potassium: 3.4 mmol/L — ABNORMAL LOW (ref 3.5–5.1)
Sodium: 139 mmol/L (ref 135–145)

## 2021-09-01 LAB — TSH: TSH: 1.634 u[IU]/mL (ref 0.350–4.500)

## 2021-09-01 MED ORDER — MIRTAZAPINE 15 MG PO TABS
15.0000 mg | ORAL_TABLET | Freq: Every day | ORAL | Status: DC
  Administered 2021-09-01 – 2021-09-03 (×3): 15 mg via ORAL
  Filled 2021-09-01 (×5): qty 1

## 2021-09-01 MED ORDER — LIDOCAINE 5 % EX PTCH
1.0000 | MEDICATED_PATCH | CUTANEOUS | Status: DC
  Administered 2021-09-01: 1 via TRANSDERMAL
  Filled 2021-09-01 (×5): qty 1

## 2021-09-01 MED ORDER — LORATADINE 10 MG PO TABS: 10.0000 mg | ORAL_TABLET | Freq: Every day | ORAL | Status: DC | PRN

## 2021-09-01 MED ORDER — BUSPIRONE HCL 7.5 MG PO TABS
7.5000 mg | ORAL_TABLET | Freq: Two times a day (BID) | ORAL | Status: DC
  Administered 2021-09-01 – 2021-09-04 (×5): 7.5 mg via ORAL
  Filled 2021-09-01 (×10): qty 1

## 2021-09-01 NOTE — BH IP Treatment Plan (Signed)
Interdisciplinary Treatment and Diagnostic Plan Update  09/01/2021 ALYANA KREITER MRN: [id number]  Principal Diagnosis: MDD (major depressive disorder), recurrent severe, without psychosis ([place])  Secondary Diagnoses: Principal Problem:   MDD (major depressive disorder), recurrent severe, without psychosis ([place])   Current Medications:  Current Facility-Administered Medications  Medication Dose Route Frequency Provider Last Rate Last Admin   acetaminophen (TYLENOL) tablet 650 mg  650 mg Oral Q6H PRN Rankin, Shuvon B, NP       alum & mag hydroxide-simeth (MAALOX/MYLANTA) 200-200-20 MG/5ML suspension 30 mL  30 mL Oral Q4H PRN Rankin, Shuvon B, NP       chlordiazePOXIDE (LIBRIUM) capsule 25 mg  25 mg Oral Q6H PRN Rankin, Shuvon B, NP   25 mg at 08/31/21 0005   chlordiazePOXIDE (LIBRIUM) capsule 25 mg  25 mg Oral QID Merrily Brittle, DO   25 mg at 09/01/21 1157   Followed by   Derrill Memo ON 09/02/2021] chlordiazePOXIDE (LIBRIUM) capsule 25 mg  25 mg Oral TID Merrily Brittle, DO       Followed by   Derrill Memo ON 09/03/2021] chlordiazePOXIDE (LIBRIUM) capsule 25 mg  25 mg Oral Barnett Applebaum, DO       Followed by   Derrill Memo ON 09/04/2021] chlordiazePOXIDE (LIBRIUM) capsule 25 mg  25 mg Oral Daily Merrily Brittle, DO       feeding supplement (ENSURE ENLIVE / ENSURE PLUS) liquid 237 mL  237 mL Oral BID BM Nelda Marseille, Amy E, MD   237 mL at 09/01/21 4098   hydrOXYzine (ATARAX) tablet 25 mg  25 mg Oral Q6H PRN Rankin, Shuvon B, NP       loperamide (IMODIUM) capsule 2-4 mg  2-4 mg Oral PRN Rankin, Shuvon B, NP       magnesium hydroxide (MILK OF MAGNESIA) suspension 30 mL  30 mL Oral Daily PRN Rankin, Shuvon B, NP       mirtazapine (REMERON) tablet 7.5 mg  7.5 mg Oral QHS Merrily Brittle, DO   7.5 mg at 08/31/21 2119   multivitamin with minerals tablet 1 tablet  1 tablet Oral Daily Rankin, Shuvon B, NP   1 tablet at 09/01/21 1191   nicotine (NICODERM CQ - dosed in mg/24 hours) patch 14 mg  14 mg Transdermal Daily Margorie John W, PA-C   14 mg at 09/01/21 4782   ondansetron (ZOFRAN-ODT) disintegrating tablet 4 mg  4 mg Oral Q6H PRN Rankin, Shuvon B, NP       traZODone (DESYREL) tablet 50 mg  50 mg Oral QHS PRN Luetta Nutting, DO   50 mg at 08/31/21 0033   PTA Medications: Medications Prior to Admission  Medication Sig Dispense Refill Last Dose   ALPRAZolam (XANAX) 1 MG tablet Take 1 mg by mouth 3 (three) times daily as needed for anxiety or sleep.      amphetamine-dextroamphetamine (ADDERALL) 10 MG tablet Take 10 mg by mouth daily with breakfast. No active RX- still has some left over and takes sporadically      cyclobenzaprine (FLEXERIL) 5 MG tablet Take 5-10 mg by mouth 3 (three) times daily as needed for muscle spasms (pain).      D3 50 MCG (2000 UT) TABS Take 2,000 Units by mouth daily.      tiZANidine (ZANAFLEX) 4 MG tablet Take 4 mg by mouth every 8 (eight) hours as needed for muscle spasms.      vitamin B-12 (CYANOCOBALAMIN) 1000 MCG tablet Take 1,000 mcg by mouth 2 (two) times daily.  Patient Stressors: Financial difficulties   Health problems   Marital or family conflict   Other: Son's medical issues    Patient Strengths: Active sense of humor  Capable of independent living  Communication skills  General fund of knowledge   Treatment Modalities: Medication Management, Group therapy, Case management,  1 to 1 session with clinician, Psychoeducation, Recreational therapy.   Physician Treatment Plan for Primary Diagnosis: MDD (major depressive disorder), recurrent severe, without psychosis ([place]) Long Term Goal(s):     Short Term Goals:    Medication Management: Evaluate patient's response, side effects, and tolerance of medication regimen.  Therapeutic Interventions: 1 to 1 sessions, Unit Group sessions and Medication administration.  Evaluation of Outcomes: Progressing  Physician Treatment Plan for Secondary Diagnosis: Principal Problem:   MDD (major depressive disorder), recurrent  severe, without psychosis ([place])  Long Term Goal(s):     Short Term Goals:       Medication Management: Evaluate patient's response, side effects, and tolerance of medication regimen.  Therapeutic Interventions: 1 to 1 sessions, Unit Group sessions and Medication administration.  Evaluation of Outcomes: Progressing   RN Treatment Plan for Primary Diagnosis: MDD (major depressive disorder), recurrent severe, without psychosis ([place]) Long Term Goal(s): Knowledge of disease and therapeutic regimen to maintain health will improve  Short Term Goals: Ability to demonstrate self-control, Ability to participate in decision making will improve, and Ability to identify and develop effective coping behaviors will improve  Medication Management: RN will administer medications as ordered by provider, will assess and evaluate patient's response and provide education to patient for prescribed medication. RN will report any adverse and/or side effects to prescribing provider.  Therapeutic Interventions: 1 on 1 counseling sessions, Psychoeducation, Medication administration, Evaluate responses to treatment, Monitor vital signs and CBGs as ordered, Perform/monitor CIWA, COWS, AIMS and Fall Risk screenings as ordered, Perform wound care treatments as ordered.  Evaluation of Outcomes: Progressing   LCSW Treatment Plan for Primary Diagnosis: MDD (major depressive disorder), recurrent severe, without psychosis ([place]) Long Term Goal(s): Safe transition to appropriate next level of care at discharge, Engage patient in therapeutic group addressing interpersonal concerns.  Short Term Goals: Engage patient in aftercare planning with referrals and resources, Increase social support, and Increase emotional regulation  Therapeutic Interventions: Assess for all discharge needs, 1 to 1 time with Social worker, Explore available resources and support systems, Assess for adequacy in community support network, Educate  family and significant other(s) on suicide prevention, Complete Psychosocial Assessment, Interpersonal group therapy.  Evaluation of Outcomes: Progressing   Progress in Treatment: Attending groups: Yes. Participating in groups: Yes. Taking medication as prescribed: Yes. Toleration medication: Yes. Family/Significant other contact made: Yes, individual(s) contacted:  boyfriend Patient understands diagnosis: Yes. Discussing patient identified problems/goals with staff: Yes. Medical problems stabilized or resolved: Yes. Denies suicidal/homicidal ideation: Yes. Issues/concerns per patient self-inventory: No. Other: None  New problem(s) identified: No, Describe:  None  New Short Term/Long Term Goal(s):medication stabilization, elimination of SI thoughts, development of comprehensive mental wellness plan.   Patient Goals:  "be focused and get organized"  Discharge Plan or Barriers: Patient recently admitted. CSW will continue to follow and assess for appropriate referrals and possible discharge planning.   Reason for Continuation of Hospitalization: Depression Medication stabilization Withdrawal symptoms  Estimated Length of Stay: 3-5 days   Scribe for Treatment Team: Eliott Nine 09/01/2021 12:12 PM

## 2021-09-01 NOTE — Progress Notes (Signed)
D: Pt alert and oriented. Pt rates depression 0/10, hopelessness 0/10, and anxiety 0/10.  Pt reports energy level as good and concentration as being good.  Pt denies experiencing any pain at this time. Pt denies experiencing any SI/HI, or AVH at this time.   A: Scheduled medications administered to pt, per MD orders. Support and encouragement provided. Frequent verbal contact made. Routine safety checks conducted q15 minutes.   R: No adverse drug reactions noted. Pt verbally contracts for safety at this time. Pt complaint with medications and treatment plan. Pt interacts well with others on the unit. Pt remains safe at this time. Will continue to monitor.

## 2021-09-01 NOTE — BHH Group Notes (Signed)
Pt  attended and contributed to group discussion.

## 2021-09-01 NOTE — BHH Group Notes (Signed)
Pt attended and contributed to goals group.

## 2021-09-01 NOTE — Group Note (Signed)
LCSW Group Therapy Note   Group Date: 09/01/2021 Start Time: 1300 End Time: 1400   Type of Therapy and Topic:  Group Therapy: Boundaries  Participation Level:  None  Description of Group: This group will address the use of boundaries in their personal lives. Patients will explore why boundaries are important, the difference between healthy and unhealthy boundaries, and negative and postive outcomes of different boundaries and will look at how boundaries can be crossed.  Patients will be encouraged to identify current boundaries in their own lives and identify what kind of boundary is being set. Facilitators will guide patients in utilizing problem-solving interventions to address and correct types boundaries being used and to address when no boundary is being used. Understanding and applying boundaries will be explored and addressed for obtaining and maintaining a balanced life. Patients will be encouraged to explore ways to assertively make their boundaries and needs known to significant others in their lives, using other group members and facilitator for role play, support, and feedback.  Therapeutic Goals:  1.  Patient will identify areas in their life where setting clear boundaries could be  used to improve their life.  2.  Patient will identify signs/triggers that a boundary is not being respected. 3.  Patient will identify two ways to set boundaries in order to achieve balance in  their lives: 4.  Patient will demonstrate ability to communicate their needs and set boundaries  through discussion and/or role plays  Summary of Patient Progress:  Pt came to group and received worksheet but was pulled out by MD.  Therapeutic Modalities:   Cognitive [place], [place] 09/01/2021  1:21 PM

## 2021-09-01 NOTE — Progress Notes (Addendum)
°  On assessment, pt reports anxiety and depression 3/10. Pt's mood and affect was depressed and anxious.  Pt denies SI/HI and verbally contracts for safety.  Pt denies AVH.  Medication administered.  Pt remains safe on unit with Q 15 minute safety checks.    08/31/21 2119  Psych Admission Type (Psych Patients Only)  Admission Status Voluntary  Psychosocial Assessment  Patient Complaints Anxiety;Depression  Eye Contact Fair  Facial Expression Anxious  Affect Anxious;Irritable  Speech Logical/coherent  Interaction Assertive  Motor Activity Slow  Appearance/Hygiene Disheveled  Behavior Characteristics Cooperative  Mood Depressed;Anxious  Thought Process  Coherency WDL  Content WDL  Delusions None reported or observed  Perception WDL  Hallucination None reported or observed  Judgment Poor  Confusion WDL  Danger to Self  Current suicidal ideation? Denies  Self-Injurious Behavior No self-injurious ideation or behavior indicators observed or expressed   Agreement Not to Harm Self Yes  Description of Agreement verbal contract for safety  Danger to Others  Danger to Others None reported or observed

## 2021-09-01 NOTE — Plan of Care (Signed)
°  Problem: Coping: Goal: Will verbalize feelings Outcome: Progressing   Problem: Safety: Goal: Ability to disclose and discuss suicidal ideas will improve Outcome: Progressing Goal: Ability to identify and utilize support systems that promote safety will improve Outcome: Progressing

## 2021-09-01 NOTE — Progress Notes (Addendum)
[facility] MD Progress Note  09/01/2021 3:21 PM Lori Underwood  MRN: [id number]  CC: MDD  Subjective: Lori Underwood is a 51 y.o. female with PPHx of MDD, GAD, PTSD and ADHD who presented to [facility] ED for active SI (OD on pills) and who was then transferred to [facility] for continuous observation while awaiting an inpatient psychiatric bed. She was then admitted Voluntary to [facility] for active SI and treatment of MDD and GAD in the setting of dispute with her son and his IVC.  [place] Day 2   Overnight Events: No acute events overnight or behavioral issues noted in chart. Patient was compliant with scheduled meds, no agitation PRN's required, and attended group therapies appropriately. CIWA-Ar Total: 3  Interim History: Patient was evaluated today with attending Dr. Nelda Marseille.  Patient was initially seen in the 300 hall group room, and was pleasant and cooperative with evaluation.  Patient reported feeling "much calmer and less stressed".  However she does still feel worried and overwhelmed, perseverating on having difficulties with coming up with the next step in finding a balance with getting to the doctor, caring for her son and being with her boyfriend.  She reported continued difficulty with prioritizing herself and reported finding herself ruminating about her son.  However patient reported that group has been very helpful and she has learned some coping mechanisms. She apologized for her irritability yesterday. She stated that the Remeron allowed her to sleep, without feeling overly groggy in the morning, which really improved her mood. She rated her anxiety and depression as 5/10 with 10/10 corresponding to the day that she arrived. She reported good sleep, with no difficulties falling asleep, staying asleep or early awakenings.  She estimated getting about 7 hours of sleep, which is the most she reported having in a while.  She also reported improving appetite, saying that she is able to eat at  every meal. Patient denied medication side effects and is tolerating it well. Patient denied SI/HI/AVH, delusions, paranoia, first rank symptoms, and contracted to safety on the unit. Patient was not grossly responding to internal/external stimuli nor made any delusional statements during encounter.  Otherwise, patient had no other concerns or questions and was amenable to the plan.   Diagnosis: Principal Problem:   MDD (major depressive disorder), recurrent severe, without psychosis ([place]) Active Problems:   PTSD (post-traumatic stress disorder)   GAD (generalized anxiety disorder)   Total Time spent with patient:  I personally spent 30 minutes on the unit in direct patient care. The direct patient care time included face-to-face time with the patient, reviewing the patient's chart, communicating with other professionals, and coordinating care. Greater than 50% of this time was spent in counseling or coordinating care with the patient regarding goals of hospitalization, psycho-education, and discharge planning needs.   Past Psychiatric History: See H&P  Past Medical History:  Past Medical History:  Diagnosis Date   Back pain    Cancer ([place])    Diverticulitis    Endometriosis 2009   Generalized anxiety disorder    IBS (irritable bowel syndrome)    mild   PTSD (post-traumatic stress disorder)    Sleep apnea     Past Surgical History:  Procedure Laterality Date   ABDOMINAL HYSTERECTOMY  2011, 2012   BREAST LUMPECTOMY     CESAREAN SECTION  1996   MYRINGOTOMY WITH TUBE PLACEMENT Left 08/29/2015   OVARIAN CYST REMOVAL     WISDOM TOOTH EXTRACTION  1980s  Family History:  Family History  Problem Relation Age of Onset   Breast cancer Mother    Anuerysm Father        heart   Lung cancer Maternal Grandmother    Heart attack Maternal Grandfather    Anxiety disorder Brother    Depression Brother    Anxiety disorder Son    Irritable bowel syndrome Son    Family Psychiatric  History: See H&P  Social History:  Social History   Substance and Sexual Activity  Alcohol Use Not Currently   Alcohol/week: 3.0 standard drinks   Types: 3 Standard drinks or equivalent per week     Social History   Substance and Sexual Activity  Drug Use No    Social History   Socioeconomic History   Marital status: Divorced    Spouse name: Not on file   Number of children: 2   Years of education: 12   Highest education level: Not on file  Occupational History   Occupation: Artist: OTHER    Comment: Hydrographic surveyor  Tobacco Use   Smoking status: Every Day    Packs/day: 0.50    Years: 31.00    Pack years: 15.50    Types: Cigarettes    Start date: 1990   Smokeless tobacco: Never  Vaping Use   Vaping Use: Never used  Substance and Sexual Activity   Alcohol use: Not Currently    Alcohol/week: 3.0 standard drinks    Types: 3 Standard drinks or equivalent per week   Drug use: No   Sexual activity: Not Currently  Other Topics Concern   Not on file  Social History Narrative   epworth sleepiness scale = 3 (09/13/2015)   Social Determinants of Health   Financial Resource Strain: Not on file  Food Insecurity: Not on file  Transportation Needs: Not on file  Physical Activity: Not on file  Stress: Not on file  Social Connections: Not on file   Appetite: Improving   Current Medications: Current Facility-Administered Medications  Medication Dose Route Frequency Provider Last Rate Last Admin   acetaminophen (TYLENOL) tablet 650 mg  650 mg Oral Q6H PRN Rankin, Shuvon B, NP       alum & mag hydroxide-simeth (MAALOX/MYLANTA) 200-200-20 MG/5ML suspension 30 mL  30 mL Oral Q4H PRN Rankin, Shuvon B, NP       busPIRone (BUSPAR) tablet 7.5 mg  7.5 mg Oral BID Merrily Brittle, DO       chlordiazePOXIDE (LIBRIUM) capsule 25 mg  25 mg Oral Q6H PRN Rankin, Shuvon B, NP   25 mg at 08/31/21 0005   chlordiazePOXIDE (LIBRIUM) capsule 25 mg  25 mg Oral QID  Merrily Brittle, DO   25 mg at 09/01/21 1157   Followed by   Derrill Memo ON 09/02/2021] chlordiazePOXIDE (LIBRIUM) capsule 25 mg  25 mg Oral TID Merrily Brittle, DO       Followed by   Derrill Memo ON 09/03/2021] chlordiazePOXIDE (LIBRIUM) capsule 25 mg  25 mg Oral Barnett Applebaum, DO       Followed by   Derrill Memo ON 09/04/2021] chlordiazePOXIDE (LIBRIUM) capsule 25 mg  25 mg Oral Daily Merrily Brittle, DO       feeding supplement (ENSURE ENLIVE / ENSURE PLUS) liquid 237 mL  237 mL Oral BID BM Nelda Marseille, Beverly Suriano E, MD   237 mL at 09/01/21 7342   hydrOXYzine (ATARAX) tablet 25 mg  25 mg Oral Q6H PRN Rankin, Shuvon B, NP  lidocaine (LIDODERM) 5 % 1 patch  1 patch Transdermal Q24H Merrily Brittle, DO       loperamide (IMODIUM) capsule 2-4 mg  2-4 mg Oral PRN Rankin, Shuvon B, NP       loratadine (CLARITIN) tablet 10 mg  10 mg Oral Daily PRN Merrily Brittle, DO       magnesium hydroxide (MILK OF MAGNESIA) suspension 30 mL  30 mL Oral Daily PRN Rankin, Shuvon B, NP       mirtazapine (REMERON) tablet 15 mg  15 mg Oral QHS Merrily Brittle, DO       multivitamin with minerals tablet 1 tablet  1 tablet Oral Daily Rankin, Shuvon B, NP   1 tablet at 09/01/21 4166   nicotine (NICODERM CQ - dosed in mg/24 hours) patch 14 mg  14 mg Transdermal Daily Margorie John W, PA-C   14 mg at 09/01/21 0821   ondansetron (ZOFRAN-ODT) disintegrating tablet 4 mg  4 mg Oral Q6H PRN Rankin, Shuvon B, NP       traZODone (DESYREL) tablet 50 mg  50 mg Oral QHS PRN Luetta Nutting, DO   50 mg at 08/31/21 0033    Lab Results:  No results found for this or any previous visit (from the past 48 hour(s)).  Blood Alcohol level:  Lab Results  Component Value Date   ETH <10 08/28/2021    Physical Findings:  Musculoskeletal: Strength & Muscle Tone: within normal limits Gait & Station: normal Patient leans: N/A  Psychiatric Specialty Exam: Physical Exam Vitals and nursing note reviewed.  Constitutional:      General: She is awake. She is not in  acute distress.    Appearance: She is not ill-appearing or diaphoretic.  HENT:     Head: Normocephalic.  Pulmonary:     Effort: Pulmonary effort is normal. No respiratory distress.  Neurological:     General: No focal deficit present.     Mental Status: She is alert.  Psychiatric:        Behavior: Behavior is cooperative.    Review of Systems  Respiratory:  Negative for shortness of breath.   Cardiovascular:  Negative for chest pain.  Gastrointestinal:  Negative for nausea and vomiting.  Neurological:  Negative for dizziness and headaches.    Body mass index is 21.58 kg/m. Temp:  [97.7 F (36.5 C)-98 F (36.7 C)] 98 F (36.7 C) (02/03 0627) Pulse Rate:  [67-79] 73 (02/03 1152) Resp:  [16] 16 (02/03 0628) BP: (117-133)/(75-86) 131/83 (02/03 1152) SpO2:  [100 %] 100 % (02/03 1152)  General Appearance:  Appropriate for Environment; Fairly Groomed (Tired appearing)    Eye Contact:   Good    Speech:   Clear and Coherent; Normal Rate    Volume:   Normal    Mood:   Anxious - less irritable    Affect:   anxious    Thought Process:   Circumstantial and at times tangential   Descriptions of Associations: Circumstantial   Orientation:   Full (Time, Place and Person)    Thought Content:   Ruminative on psychosocial stressors; denies SI, HI, AVH, paranoia, delusions, ideas of reference, or first rank symptoms  Hallucinations: None  Ideas of reference: None    Suicidal Thoughts:   Denied    Homicidal Thoughts:   Denied    Memory:   Immediate Good; Recent Good; Remote Fair    Judgement:   Fair    Insight:   Shallow    Psychomotor Activity:  Normal (No tremors or restlessness noted)    Concentration:   Fair    Attention Span:   Fair   Recall:   Good    Fund of Knowledge:   Good    Language:   Good    Handed:   Right    Assets:   Communication Skills; Desire for Improvement; Financial Resources/Insurance; Housing; Intimacy; Social  Support    ADL's:  Intact  Cognition:  WNL  Sleep:   Total time unrecorded      CIWA:  CIWA-Ar Total: 3     Treatment Assessment & Plan Summary: Lori Underwood is a 51 y.o. female with PPHx of MDD, GAD, PTSD and ADHD who presented to [facility] ED for active SI (OD on pills) and who was then transferred to [facility] for continuous observation while awaiting an inpatient psychiatric bed. She was then admitted Voluntary to [facility] for active SI and treatment of MDD and GAD in the setting of dispute with her son and his IVC. She appears to be clinically improving with limit setting, groups, and structure of milieu as well as with start of medication.  PLAN: MDD recurrent severe without psychotic features PTSD by hx GAD per hx (r/o panic d/o) Cluster B traits Patient still exhibited symptoms of guilt, negative ruminations, excessive worrying, and her thought process tangential and circumstantial. However she responded well to Remeron and is getting more sleep which has improved her mood.  Compared to yesterday, she was more amenable to medical management, and agreeable to increasing her Remeron and starting BuSpar for her anxiety.  We will rule out thyroid dysfunction per below. Recommended trauma therapy and family therapy after discharge Increased Remeron 7.5 mg to 15 mg qHS for residual anxiety and depressive sx Started BuSpar 7.5 mg BID for anxiety and irritability (r/b/se/a to med reviewed and she consents to med trial) Follow-up TSH pending  R/o sedative/hypnotic anxiolytic use d/o R/o stimulant use d/o No symptoms of withdrawal.  Patient understood that after discharge, she will have to follow up with an outpatient provider if she is to resume benzodiazepines after discharge. She was advised not to restart stimulants given her anxiety issues. Continued to monitor for signs of withdrawal and advised no scripts for controlled meds at discharge No stimulants ordered  Continued  CIWA with Librium PRN per protocol (recent CIWA scores 1,1,3) Continued Librium taper per MAR (end 2/6)  Mild Hypokalemia - Repeat BMP pending and replete if needed  Dispo: PCP referral and appointment for Dr. Bridgett Larsson at [facility] - see if she can start PHP/IOP Barrier: Librium taper, ending 2/6  Safety, monitoring and disposition planning: The patient was seen and evaluated on the unit.  The patient's chart was reviewed and nursing notes were reviewed.  The patient's case was discussed in multidisciplinary team meeting.  Plan and drug side effects were discussed with patient who was amendable. Social work and case management to assist with discharge planning and identification of hospital follow-up needs prior to discharge. Discharge Concerns: Need to establish a safety plan; Medication compliance and effectiveness Discharge Goals: Return home with outpatient referrals for mental health follow-up including medication management/psychotherapy Safety and Monitoring: Voluntary status at inpatient psychiatric unit for safety, stabilization and treatment Daily contact with patient to assess and evaluate symptoms and progress in treatment and medical management Patient's case to be discussed in multi-disciplinary team meeting Observation Level: Per above Vital signs:  q12 hours Precautions: Per chart  Signed: Merrily Brittle, DO  Psychiatry Resident, PGY-1 [facility] 09/01/2021, 3:21 PM

## 2021-09-01 NOTE — Progress Notes (Signed)
°   09/01/21 1200  Psych Admission Type (Psych Patients Only)  Admission Status Voluntary  Psychosocial Assessment  Patient Complaints Anxiety;Depression  Eye Contact Fair  Facial Expression Anxious  Affect Anxious;Irritable  Speech Logical/coherent  Interaction Assertive  Motor Activity Slow  Appearance/Hygiene Disheveled  Behavior Characteristics Cooperative  Mood Anxious  Thought Process  Coherency WDL  Content WDL  Delusions None reported or observed  Perception WDL  Hallucination None reported or observed  Judgment Poor  Confusion WDL  Danger to Self  Current suicidal ideation? Denies  Self-Injurious Behavior No self-injurious ideation or behavior indicators observed or expressed   Agreement Not to Harm Self Yes  Description of Agreement verbal contract for safety  Danger to Others  Danger to Others None reported or observed

## 2021-09-01 NOTE — Progress Notes (Signed)
Patient did attend the evening speaker AA meeting.  

## 2021-09-01 NOTE — BHH Suicide Risk Assessment (Signed)
[facility] INPATIENT:  Family/Significant Other Suicide Prevention Education  Suicide Prevention Education:  Education Completed; Lori Underwood [phone] (Boyfriend) has been identified by the patient as the family member/significant other with whom the patient will be residing, and identified as the person(s) who will aid the patient in the event of a mental health crisis (suicidal ideations/suicide attempt).  With written consent from the patient, the family member/significant other has been provided the following suicide prevention education, prior to the and/or following the discharge of the patient.  The suicide prevention education provided includes the following: Suicide risk factors Suicide prevention and interventions National Suicide Hotline telephone number [facility] assessment telephone number [facility] Emergency Assistance [place] and/or Residential Mobile Crisis Unit telephone number  Request made of family/significant other to: Remove weapons (e.g., guns, rifles, knives), all items previously/currently identified as safety concern.   Remove drugs/medications (over-the-counter, prescriptions, illicit drugs), all items previously/currently identified as a safety concern.  The family member/significant other verbalizes understanding of the suicide prevention education information provided.  The family member/significant other agrees to remove the items of safety concern listed above.  CSW spoke with Mr. Lori Underwood who states that his girlfriend has been dealing with a lot of stress from her son's mental health condition.  He states that "She would be OK if her son's issues were not affecting her". Mr. Lori Underwood believes that therapy would be "extremely beneficial for her because she has experienced a lot of trauma in her life and she needs to be able to talk about it and sort it out". He states that he has know his girlfriend for 20 years and has been dating  her for the past 10 years. He confirms that she will be living with him after discharge.   Mr. Lori Underwood states that there are no firearms or weapons in his home. CSW completed SPE with Mr. Lori Underwood.   Lori Underwood 09/01/2021, 2:17 PM

## 2021-09-02 MED ORDER — POTASSIUM CHLORIDE CRYS ER 20 MEQ PO TBCR
20.0000 meq | EXTENDED_RELEASE_TABLET | Freq: Two times a day (BID) | ORAL | Status: AC
  Administered 2021-09-02 (×2): 20 meq via ORAL
  Filled 2021-09-02 (×2): qty 1

## 2021-09-02 NOTE — Progress Notes (Signed)
Adult Psychoeducational Group Note  Date:  09/02/2021 Time:  2:05 PM  Group Topic/Focus:  Goals Group:   The focus of this group is to help patients establish daily goals to achieve during treatment and discuss how the patient can incorporate goal setting into their daily lives to aide in recovery.  Participation Level:  Active  Participation Quality:  Appropriate  Affect:  Appropriate  Cognitive:  Appropriate  Insight: Appropriate  Engagement in Group:  Engaged  Modes of Intervention:  Discussion  Additional Comments:  Patient attended morning orientation/goal group and participated.   Lori Underwood W Annleigh Knueppel 09/04/7491, 2:05 PM

## 2021-09-02 NOTE — B[facility] Group Notes (Signed)
Psychoeducational Group Note ° ° ° °Date:09/02/21 °Time: 1300-1400 ° ° ° °Purpose of Group: . The group focus' on teaching patients on how to identify their needs and their Life Skills:  A group where two lists are made. What people need and what are things that we do that are unhealthy. The lists are developed by the patients and it is explained that we often do the actions that are not healthy to get our list of needs met. ° °Goal:: to develop the coping skills needed to get their needs met ° °Participation Level:  Active ° °Participation Quality:  Appropriate ° °Affect:  Appropriate ° °Cognitive:  Oriented ° °Insight:  Improving ° °Engagement in Group:  Engaged ° °Additional Comments:  ... ° °Harrell Niehoff A ° °

## 2021-09-02 NOTE — Progress Notes (Signed)
D: Pt alert and oriented. Pt rates depression 0/10, hopelessness 0/10, and anxiety 0/10.  Pt reports energy level as good and concentration as being good.  Pt denies experiencing any pain at this time. Pt denies experiencing any SI/HI, or AVH at this time.   A: Scheduled medications administered to pt, per MD orders. Support and encouragement provided. Frequent verbal contact made. Routine safety checks conducted q15 minutes.   R: No adverse drug reactions noted. Pt verbally contracts for safety at this time. Pt complaint with medications and treatment plan. Pt interacts well with others on the unit. Pt remains safe at this time. Will continue to monitor.

## 2021-09-02 NOTE — BHH Group Notes (Signed)
Pt didn't attend group. 

## 2021-09-02 NOTE — BHH Group Notes (Signed)
Goals Group 09/02/2021   Group Focus: affirmation, clarity of thought, and goals/reality orientation Treatment Modality:  Psychoeducation Interventions utilized were assignment, group exercise, and support Purpose: To be able to understand and verbalize the reason for their admission to the hospital. To understand that the medication helps with their chemical imbalance but they also need to work on their choices in life. To be challenged to develop a list of 30 positives about themselves. Also introduce the concept that "feelings" are not reality.  Participation Level:  Did not attend Paulino Rily

## 2021-09-02 NOTE — Progress Notes (Addendum)
[facility] MD Progress Note  09/02/2021 6:17 PM Lori Underwood  MRN: [id number]  CC: MDD  Subjective: Lori Underwood is a 51 y.o. female with PPHx of MDD, GAD, PTSD and ADHD who presented to [facility] ED for active SI (OD on pills) and who was then transferred to [facility] for continuous observation while awaiting an inpatient psychiatric bed. She was then admitted Voluntary to [facility] for active SI and treatment of MDD and GAD in the setting of dispute with her son and his IVC.  Lori Underwood 3   Overnight Events: No acute events overnight or behavioral issues noted in chart. Patient was compliant with scheduled meds, no agitation PRN's required, and attended group therapies appropriately. CIWA-Ar Total: 0  Interim History: Patient was evaluated this AM in the 300 hall group room.  Patient appeared anxious and was irritable initially during encounter.Patient immediately reported that she wants to stop all of her medications and only continue with the Librium, stating she wants to limit the amount of chemicals into her body.  She reported that all the chemicals [meds] are disrupting the electrolyte balance, that is why her potassium is low.  She noted that during her chemo treatment, that her potassium was also low. She perseverated on how she needs to leave today so she can see her oncologist and find out if her breast cancer has returned.  Patient also rambled about how low potassium can lead to heart problems. Patient then rambled about how she does not want to take trazodone because she felt so drowsy afterwards in the morning.  Discussed with patient that she last received trazodone about 2 days ago when she was first admitted.  Discussed with patient that her K+ 3.4, is most likely due to poor p.o. intake prior to admission, and that she will be provided supplements to replete.  Towards the end of the encounter, patient was amenable to continuing her Remeron and BuSpar.  She reported a full night's sleep  and improved appetite x2 days.  Patient denied SI/HI/AVH, delusions, paranoia, first rank symptoms, and contracted to safety on the unit. Patient was not grossly responding to internal/external stimuli nor made any delusional statements during encounter. Otherwise, patient had no other concerns or questions and was amenable to the plan.   Diagnosis: Principal Problem:   MDD (major depressive disorder), recurrent severe, without psychosis ([place]) Active Problems:   PTSD (post-traumatic stress disorder)   GAD (generalized anxiety disorder)   Total Time spent with patient:  I personally spent 30 minutes on the unit in direct patient care. The direct patient care time included face-to-face time with the patient, reviewing the patient's chart, communicating with other professionals, and coordinating care. Greater than 50% of this time was spent in counseling or coordinating care with the patient regarding goals of hospitalization, psycho-education, and discharge planning needs.   Past Psychiatric History: See H&P  Past Medical History:  Past Medical History:  Diagnosis Date   Back pain    Cancer ([place])    Diverticulitis    Endometriosis 2009   Generalized anxiety disorder    IBS (irritable bowel syndrome)    mild   PTSD (post-traumatic stress disorder)    Sleep apnea     Past Surgical History:  Procedure Laterality Date   ABDOMINAL HYSTERECTOMY  2011, 2012   BREAST LUMPECTOMY     CESAREAN SECTION  1996   MYRINGOTOMY WITH TUBE PLACEMENT Left 08/29/2015   OVARIAN CYST REMOVAL  WISDOM TOOTH EXTRACTION  1980s   Family History:  Family History  Problem Relation Age of Onset   Breast cancer Mother    Anuerysm Father        heart   Lung cancer Maternal Grandmother    Heart attack Maternal Grandfather    Anxiety disorder Brother    Depression Brother    Anxiety disorder Son    Irritable bowel syndrome Son    Family Psychiatric History: See H&P  Social History:  Social History    Substance and Sexual Activity  Alcohol Use Not Currently   Alcohol/week: 3.0 standard drinks   Types: 3 Standard drinks or equivalent per week     Social History   Substance and Sexual Activity  Drug Use No    Social History   Socioeconomic History   Marital status: Divorced    Spouse name: Not on file   Number of children: 2   Years of education: 12   Highest education level: Not on file  Occupational History   Occupation: Artist: OTHER    Comment: Hydrographic surveyor  Tobacco Use   Smoking status: Every Underwood    Packs/Underwood: 0.50    Years: 31.00    Pack years: 15.50    Types: Cigarettes    Start date: 1990   Smokeless tobacco: Never  Vaping Use   Vaping Use: Never used  Substance and Sexual Activity   Alcohol use: Not Currently    Alcohol/week: 3.0 standard drinks    Types: 3 Standard drinks or equivalent per week   Drug use: No   Sexual activity: Not Currently  Other Topics Concern   Not on file  Social History Narrative   epworth sleepiness scale = 3 (09/13/2015)   Social Determinants of Health   Financial Resource Strain: Not on file  Food Insecurity: Not on file  Transportation Needs: Not on file  Physical Activity: Not on file  Stress: Not on file  Social Connections: Not on file   Appetite: Improving   Current Medications: Current Facility-Administered Medications  Medication Dose Route Frequency Provider Last Rate Last Admin   acetaminophen (TYLENOL) tablet 650 mg  650 mg Oral Q6H PRN Rankin, Shuvon B, NP       alum & mag hydroxide-simeth (MAALOX/MYLANTA) 200-200-20 MG/5ML suspension 30 mL  30 mL Oral Q4H PRN Rankin, Shuvon B, NP       busPIRone (BUSPAR) tablet 7.5 mg  7.5 mg Oral BID Merrily Brittle, DO   7.5 mg at 09/01/21 1721   chlordiazePOXIDE (LIBRIUM) capsule 25 mg  25 mg Oral Q6H PRN Rankin, Shuvon B, NP   25 mg at 08/31/21 0005   chlordiazePOXIDE (LIBRIUM) capsule 25 mg  25 mg Oral TID Merrily Brittle, DO   25 mg at  09/02/21 1122   Followed by   Derrill Memo ON 09/03/2021] chlordiazePOXIDE (LIBRIUM) capsule 25 mg  25 mg Oral BH-qamhs Merrily Brittle, DO       Followed by   Derrill Memo ON 09/04/2021] chlordiazePOXIDE (LIBRIUM) capsule 25 mg  25 mg Oral Daily Merrily Brittle, DO       feeding supplement (ENSURE ENLIVE / ENSURE PLUS) liquid 237 mL  237 mL Oral BID BM Chanin Frumkin E, MD   237 mL at 09/02/21 0814   hydrOXYzine (ATARAX) tablet 25 mg  25 mg Oral Q6H PRN Rankin, Shuvon B, NP       lidocaine (LIDODERM) 5 % 1 patch  1 patch Transdermal Q24H Alfonse Spruce,  Almyra Free, DO   1 patch at 09/01/21 1724   loperamide (IMODIUM) capsule 2-4 mg  2-4 mg Oral PRN Rankin, Shuvon B, NP       loratadine (CLARITIN) tablet 10 mg  10 mg Oral Daily PRN Merrily Brittle, DO       magnesium hydroxide (MILK OF MAGNESIA) suspension 30 mL  30 mL Oral Daily PRN Rankin, Shuvon B, NP       mirtazapine (REMERON) tablet 15 mg  15 mg Oral QHS Merrily Brittle, DO   15 mg at 09/01/21 2127   multivitamin with minerals tablet 1 tablet  1 tablet Oral Daily Rankin, Shuvon B, NP   1 tablet at 09/02/21 0815   nicotine (NICODERM CQ - dosed in mg/24 hours) patch 14 mg  14 mg Transdermal Daily Margorie John W, PA-C   14 mg at 09/02/21 0813   ondansetron (ZOFRAN-ODT) disintegrating tablet 4 mg  4 mg Oral Q6H PRN Rankin, Shuvon B, NP       potassium chloride SA (KLOR-CON M) CR tablet 20 mEq  20 mEq Oral BID Ezella Kell E, MD   20 mEq at 09/02/21 1122   traZODone (DESYREL) tablet 50 mg  50 mg Oral QHS PRN Luetta Nutting, DO   50 mg at 08/31/21 4854    Lab Results:  Results for orders placed or performed during the hospital encounter of 08/30/21 (from the past 48 hour(s))  TSH     Status: None   Collection Time: 09/01/21  6:33 PM  Result Value Ref Range   TSH 1.634 0.350 - 4.500 uIU/mL    Comment: Performed by a 3rd Generation assay with a functional sensitivity of <=0.01 uIU/mL. Performed at [facility], [place] [address], [place], [place] [address]    Basic metabolic panel     Status: Abnormal   Collection Time: 09/01/21  6:33 PM  Result Value Ref Range   Sodium 139 135 - 145 mmol/L   Potassium 3.4 (L) 3.5 - 5.1 mmol/L   Chloride 106 98 - 111 mmol/L   CO2 28 22 - 32 mmol/L   Glucose, Bld 78 70 - 99 mg/dL    Comment: Glucose reference range applies only to samples taken after fasting for at least 8 hours.   BUN 17 6 - 20 mg/dL   Creatinine, Ser 0.55 0.44 - 1.00 mg/dL   Calcium 8.7 (L) 8.9 - 10.3 mg/dL   GFR, Estimated >60 >60 mL/min    Comment: (NOTE) Calculated using the CKD-EPI Creatinine Equation (2021)    Anion gap 5 5 - 15    Comment: Performed at [facility], [place] [address] [place], [place] [address]    Blood Alcohol level:  Lab Results  Component Value Date   [facility] <10 08/28/2021    Physical Findings:  Musculoskeletal: Strength & Muscle Tone: within normal limits Gait & Station: normal Patient leans: N/A  Psychiatric Specialty Exam: Physical Exam Vitals and nursing note reviewed.  Constitutional:      General: She is awake. She is not in acute distress.    Appearance: She is not ill-appearing or diaphoretic.  HENT:     Head: Normocephalic.  Pulmonary:     Effort: Pulmonary effort is normal. No respiratory distress.  Neurological:     General: No focal deficit present.     Mental Status: She is alert.  Psychiatric:        Behavior: Behavior is cooperative.    Review of Systems  Respiratory:  Negative for shortness of breath.   Cardiovascular:  Negative for chest pain.  Gastrointestinal:  Negative for nausea and vomiting.  Neurological:  Negative for dizziness and headaches.    Body mass index is 21.58 kg/m. Temp:  [98.2 F (36.8 C)-98.6 F (37 C)] 98.6 F (37 C) (02/04 1611) Pulse Rate:  [72-91] 78 (02/04 1611) Resp:  [18] 18 (02/04 0641) BP: (114-126)/(69-75) 126/69 (02/04 1611) SpO2:  [100 %] 100 % (02/04 1611)  General Appearance:  Appropriate for Environment; Fairly  Groomed (Tired appearing)    Eye Contact:   Good    Speech:   Clear and Coherent; Normal Rate    Volume:   Normal    Mood:   Anxious and initially irritable    Affect:   anxious    Thought Process:   Circumstantial and at times tangential   Descriptions of Associations: Circumstantial   Orientation:   Full (Time, Place and Person)    Thought Content:   Ruminative on psychosocial stressors, medications, health; denies SI, HI, AVH, paranoia, delusions, ideas of reference, or first rank symptoms  Hallucinations: None  Ideas of reference: None    Suicidal Thoughts:   Denied    Homicidal Thoughts:   Denied    Memory:   Immediate Good; Recent Good; Remote Fair    Judgement:   Fair    Insight:   Shallow    Psychomotor Activity:   Normal (No tremors or restlessness noted)    Concentration:   Fair    Attention Span:   Fair   Recall:   Good    Fund of Knowledge:   Good    Language:   Good    Handed:   Right    Assets:   Communication Skills; Desire for Improvement; Financial Resources/Insurance; Housing; Intimacy; Social Support    ADL's:  Intact  Cognition:  WNL  Sleep:   Total time unrecorded      CIWA:  CIWA-Ar Total: 0     Treatment Assessment & Plan Summary: CAMILE ESTERS is a 51 y.o. female with PPHx of MDD, GAD, PTSD and ADHD who presented to [facility] ED for active SI (OD on pills) and who was then transferred to [facility] for continuous observation while awaiting an inpatient psychiatric bed. She was then admitted Voluntary to [facility] for active SI and treatment of MDD and GAD in the setting of dispute with her son and his IVC. She appears to be clinically improving with groups and structure of milieu as well as with start of medication.  After extensive discussion she was willing to continue her medications.  PLAN: MDD recurrent severe without psychotic features PTSD by hx GAD per hx (r/o panic d/o) Cluster B traits TSH  within normal limits.  Recommended trauma therapy and family therapy after discharge Continue Remeron 15 mg qHS for residual anxiety and depressive sx Continued BuSpar 7.5 mg BID for anxiety and irritability   R/o sedative/hypnotic anxiolytic use d/o R/o stimulant use d/o Patient symptoms per above. Patient understood that after discharge, she will have to follow up with an outpatient provider if she is to resume benzodiazepines after discharge. She was advised not to restart stimulants given her anxiety issues. Continued to monitor for signs of withdrawal and advised no scripts for controlled meds at discharge No stimulants ordered  Continued CIWA with Librium PRN per protocol Continued Librium taper per MAR (end 2/6)  Mild Hypokalemia Klor 72meq bid today and repeat BMP ordered  Dispo: PCP referral and appointment for Dr. Bridgett Larsson at [facility] - see if she can start PHP/IOP Barrier: Librium taper, ending 2/6  Safety, monitoring and disposition planning: The patient was seen and evaluated on the unit.  The patient's chart was reviewed and nursing notes were reviewed.  The patient's case was discussed in multidisciplinary team meeting.  Plan and drug side effects were discussed with patient who was amendable. Social work and case management to assist with discharge planning and identification of hospital follow-up needs prior to discharge. Discharge Concerns: Need to establish a safety plan; Medication compliance and effectiveness Discharge Goals: Return home with outpatient referrals for mental health follow-up including medication management/psychotherapy Safety and Monitoring: Voluntary status at inpatient psychiatric unit for safety, stabilization and treatment Daily contact with patient to assess and evaluate symptoms and progress in treatment and medical management Patient's case to be discussed in multi-disciplinary team meeting Observation Level: Per above Vital signs:  q12  hours Precautions: Per chart  Signed: Merrily Brittle, DO Psychiatry Resident, PGY-1 [place] 09/02/2021, 6:17 PM

## 2021-09-02 NOTE — Progress Notes (Signed)
°   09/02/21 1200  Psych Admission Type (Psych Patients Only)  Admission Status Voluntary  Psychosocial Assessment  Patient Complaints Anxiety  Eye Contact Fair  Facial Expression Anxious  Affect Anxious;Irritable  Speech Logical/coherent  Interaction Assertive  Motor Activity Slow  Appearance/Hygiene Disheveled  Behavior Characteristics Cooperative  Mood Anxious  Thought Process  Coherency WDL  Content WDL  Delusions None reported or observed  Perception WDL  Hallucination None reported or observed  Judgment Poor  Confusion WDL  Danger to Self  Current suicidal ideation? Denies  Self-Injurious Behavior No self-injurious ideation or behavior indicators observed or expressed   Agreement Not to Harm Self Yes  Description of Agreement verbal contract for safety  Danger to Others  Danger to Others None reported or observed

## 2021-09-02 NOTE — Group Note (Signed)
LCSW Group Therapy Note  No therapy group could be held today due to other needs on the unit that were prioritized.  The patient did not go without group, as another licensed group was held by an Therapist, sports.  Selmer Dominion, LCSW 05/20/2021 10:03 AM

## 2021-09-03 DIAGNOSIS — F332 Major depressive disorder, recurrent severe without psychotic features: Principal | ICD-10-CM

## 2021-09-03 LAB — BASIC METABOLIC PANEL
Anion gap: 6 (ref 5–15)
BUN: 12 mg/dL (ref 6–20)
CO2: 28 mmol/L (ref 22–32)
Calcium: 8.6 mg/dL — ABNORMAL LOW (ref 8.9–10.3)
Chloride: 104 mmol/L (ref 98–111)
Creatinine, Ser: 0.46 mg/dL (ref 0.44–1.00)
GFR, Estimated: >60 mL/min (ref >60)
Glucose, Bld: 90 mg/dL (ref 70–99)
Potassium: 3.8 mmol/L (ref 3.5–5.1)
Sodium: 138 mmol/L (ref 135–145)

## 2021-09-03 NOTE — Progress Notes (Signed)
Pt denies SI/HI/AVH and verbally agrees to approach staff if these become apparent or before harming themselves/others. Rates depression 0/10. Rates anxiety 3/10. Rates pain 5/10.  Pt has been out of room and interacting with other pts. Scheduled medications administered to pt, per MD orders. RN provided support and encouragement to pt. Q15 min safety checks implemented and continued. Pt safe on the unit. RN will continue to monitor and intervene as needed.   09/03/21 0758  Psych Admission Type (Psych Patients Only)  Admission Status Voluntary  Psychosocial Assessment  Patient Complaints Anxiety;Other (Comment) (pain)  Eye Contact Fair  Facial Expression Animated  Affect Appropriate to circumstance  Speech Logical/coherent  Interaction Assertive  Motor Activity Other (Comment) (WDL)  Appearance/Hygiene Improved  Behavior Characteristics Cooperative;Appropriate to situation;Calm  Mood Pleasant  Thought Process  Coherency WDL  Content WDL  Delusions None reported or observed  Perception WDL  Hallucination None reported or observed  Judgment Limited  Confusion WDL  Danger to Self  Current suicidal ideation? Denies  Self-Injurious Behavior No self-injurious ideation or behavior indicators observed or expressed   Agreement Not to Harm Self Yes  Description of Agreement verbal contract for safety  Danger to Others  Danger to Others None reported or observed

## 2021-09-03 NOTE — B[facility] Group Notes (Signed)
Adult Psychoeducational Group Not Date:  09/03/2021 Time:  2182-8833 Group Topic/Focus: PROGRESSIVE RELAXATION. A group where deep breathing is taught and tensing and relaxation muscle groups is used. Imagery is used as well.  Pts are asked to imagine 3 pillars that hold them up when they are not able to hold themselves up.  Participation Level:  Active  Participation Quality:  Appropriate  Affect:  Appropriate  Cognitive:  Oriented  Insight: Improving  Engagement in Group:  Engaged  Modes of Intervention:  Activity, Discussion, Education, and Support  Additional Comments:  Rates her energy at a 5 or 6/10. States nature, all types ofg nature, humanity and loved ones  Paulino Rily

## 2021-09-03 NOTE — Progress Notes (Signed)
°   09/03/21 2023  Psych Admission Type (Psych Patients Only)  Admission Status Voluntary  Psychosocial Assessment  Patient Complaints Anxiety  Eye Contact Fair  Facial Expression Animated  Affect Appropriate to circumstance  Speech Logical/coherent  Interaction Assertive  Motor Activity Other (Comment) (WNL)  Appearance/Hygiene Improved  Behavior Characteristics Cooperative  Mood Pleasant  Thought Process  Coherency WDL  Content WDL  Delusions None reported or observed  Perception WDL  Hallucination None reported or observed  Judgment Impaired  Confusion None  Danger to Self  Current suicidal ideation? Denies  Self-Injurious Behavior No self-injurious ideation or behavior indicators observed or expressed   Agreement Not to Harm Self Yes  Description of Agreement verbal contract for safety  Danger to Others  Danger to Others None reported or observed   D: Patient reports she had a "better day" and is tolerating medication well.  A: Medications administered as prescribed. Support and encouragement provided as needed.  R: Patient remains safe on the unit. Will continue to monitor for safety and stability.

## 2021-09-03 NOTE — [facility] Group Notes (Signed)
Psychoeducational Group Note  Date:  09/03/2021 Time:  1300-1400   Group Topic/Focus: This is a continuation of the group from Saturday. Pt's have been asked to formulate a list of 30 positives about themselves. This list is to be read 2 times a day for 30 days, looking in a mirror. Changing patterns of negative self talk. Also discussed is the fact that there have been some people who hurt [place] in the past. We keep that memory alive within [place]. Ways to cope with this are discused   Participation Level:  Active  Participation Quality:  Appropriate  Affect:  Appropriate  Cognitive:  Oriented  Insight: Improving  Engagement in Group:  Engaged  Modes of Intervention:  Activity, Discussion, Education, and Support  Additional Comments:  Rates her energy as a 6/10.  Participated fully in the group.  Paulino Rily

## 2021-09-03 NOTE — Progress Notes (Signed)
[facility] MD Progress Note  09/03/2021 7:28 AM Lori Underwood  MRN: [id number]  CC: depression  Reason for Admission: Lori Underwood is a 51 y.o. female with PPHx of MDD, GAD, PTSD and ADHD who presented to [facility] ED for active SI (thought to OD on pills) and who was then transferred to [facility] for continuous observation while awaiting an inpatient psychiatric bed. She was then admitted Voluntary to [facility] for active SI and treatment of MDD and GAD in the setting of dispute with her son and his IVC.  BHH Day 4   Overnight Events: The patient's chart was reviewed and nursing notes were reviewed. The patient's case was discussed in multidisciplinary team meeting. Per nursing, patient was irritable and anxious yesterday on day shift and attended select groups. On night shift she had no acute issues. No behavioral issues or safety concerns noted. Per MAR she refused morning Buspar and lidoderm patch but was compliant with other scheduled medications and did not require PRNs.   Information Obtained Today During Patient Interview: The patient was seen and evaluated on the unit. On assessment today the patient reports she is feeling better today.  She reports an improved appetite.  She states she tossed and turned some last night but did finally get a good "stretch of sleep" thereafter.  She reports tolerating the medications without noted side effects and can describe some of the coping skills she is learning on the unit.  She is trying to avoid ruminating and focusing on all the things she feels she needs to do and is trying to prioritize her to do list and avoid worrying about things out of her current control.  She denies SI or HI and denies AVH, paranoia or delusions.  She reports some mild constipation that is resolved as well as low back pain for which she is using ice pack and voices no other physical complaints.  Supportive therapy was provided.  We discussed that her hypokalemia has resolved and  she was made aware that her calcium is slightly low and needs to be followed up as an outpatient.  Diagnosis: Principal Problem:   MDD (major depressive disorder), recurrent severe, without psychosis ([place]) Active Problems:   PTSD (post-traumatic stress disorder)   GAD (generalized anxiety disorder)   Total Time spent with patient:  I personally spent 30 minutes on the unit in direct patient care. The direct patient care time included face-to-face time with the patient, reviewing the patient's chart, communicating with other professionals, and coordinating care. Greater than 50% of this time was spent in counseling or coordinating care with the patient regarding goals of hospitalization, psycho-education, and discharge planning needs.   Past Psychiatric History: See H&P  Past Medical History:  Past Medical History:  Diagnosis Date   Back pain    Cancer ([place])    Diverticulitis    Endometriosis 2009   Generalized anxiety disorder    IBS (irritable bowel syndrome)    mild   PTSD (post-traumatic stress disorder)    Sleep apnea     Past Surgical History:  Procedure Laterality Date   ABDOMINAL HYSTERECTOMY  2011, 2012   BREAST LUMPECTOMY     CESAREAN SECTION  1996   MYRINGOTOMY WITH TUBE PLACEMENT Left 08/29/2015   OVARIAN CYST REMOVAL     WISDOM TOOTH EXTRACTION  1980s   Family History:  Family History  Problem Relation Age of Onset   Breast cancer Mother    Anuerysm Father  heart   Lung cancer Maternal Grandmother    Heart attack Maternal Grandfather    Anxiety disorder Brother    Depression Brother    Anxiety disorder Son    Irritable bowel syndrome Son    Family Psychiatric History: See H&P  Social History:  Social History   Substance and Sexual Activity  Alcohol Use Not Currently   Alcohol/week: 3.0 standard drinks   Types: 3 Standard drinks or equivalent per week     Social History   Substance and Sexual Activity  Drug Use No    Social History    Socioeconomic History   Marital status: Divorced    Spouse name: Not on file   Number of children: 2   Years of education: 12   Highest education level: Not on file  Occupational History   Occupation: Artist: OTHER    Comment: Hydrographic surveyor  Tobacco Use   Smoking status: Every Day    Packs/day: 0.50    Years: 31.00    Pack years: 15.50    Types: Cigarettes    Start date: 1990   Smokeless tobacco: Never  Vaping Use   Vaping Use: Never used  Substance and Sexual Activity   Alcohol use: Not Currently    Alcohol/week: 3.0 standard drinks    Types: 3 Standard drinks or equivalent per week   Drug use: No   Sexual activity: Not Currently  Other Topics Concern   Not on file  Social History Narrative   epworth sleepiness scale = 3 (09/13/2015)   Social Determinants of Health   Financial Resource Strain: Not on file  Food Insecurity: Not on file  Transportation Needs: Not on file  Physical Activity: Not on file  Stress: Not on file  Social Connections: Not on file   Appetite: Improving   Current Medications: Current Facility-Administered Medications  Medication Dose Route Frequency Provider Last Rate Last Admin   acetaminophen (TYLENOL) tablet 650 mg  650 mg Oral Q6H PRN Rankin, Shuvon B, NP       alum & mag hydroxide-simeth (MAALOX/MYLANTA) 200-200-20 MG/5ML suspension 30 mL  30 mL Oral Q4H PRN Rankin, Shuvon B, NP       busPIRone (BUSPAR) tablet 7.5 mg  7.5 mg Oral BID Lori Brittle, DO   7.5 mg at 09/02/21 1835   chlordiazePOXIDE (LIBRIUM) capsule 25 mg  25 mg Oral Barnett Applebaum, DO       Followed by   Derrill Memo ON 09/04/2021] chlordiazePOXIDE (LIBRIUM) capsule 25 mg  25 mg Oral Daily Lori Brittle, DO       feeding supplement (ENSURE ENLIVE / ENSURE PLUS) liquid 237 mL  237 mL Oral BID BM Lori Underwood, Lori House E, MD   237 mL at 09/02/21 1833   lidocaine (LIDODERM) 5 % 1 patch  1 patch Transdermal Q24H Lori Brittle, DO   1 patch at 09/01/21  1724   loratadine (CLARITIN) tablet 10 mg  10 mg Oral Daily PRN Lori Brittle, DO       magnesium hydroxide (MILK OF MAGNESIA) suspension 30 mL  30 mL Oral Daily PRN Rankin, Shuvon B, NP       mirtazapine (REMERON) tablet 15 mg  15 mg Oral QHS Lori Brittle, DO   15 mg at 09/02/21 2102   multivitamin with minerals tablet 1 tablet  1 tablet Oral Daily Rankin, Shuvon B, NP   1 tablet at 09/02/21 0815   nicotine (NICODERM CQ - dosed in mg/24 hours)  patch 14 mg  14 mg Transdermal Daily Margorie John W, PA-C   14 mg at 09/02/21 0813   traZODone (DESYREL) tablet 50 mg  50 mg Oral QHS PRN Luetta Nutting, DO   50 mg at 08/31/21 8676    Lab Results:  Results for orders placed or performed during the hospital encounter of 08/30/21 (from the past 48 hour(s))  TSH     Status: None   Collection Time: 09/01/21  6:33 PM  Result Value Ref Range   TSH 1.634 0.350 - 4.500 uIU/mL    Comment: Performed by a 3rd Generation assay with a functional sensitivity of <=0.01 uIU/mL. Performed at [facility], [place] [address], [place], [place] [address]   Basic metabolic panel     Status: Abnormal   Collection Time: 09/01/21  6:33 PM  Result Value Ref Range   Sodium 139 135 - 145 mmol/L   Potassium 3.4 (L) 3.5 - 5.1 mmol/L   Chloride 106 98 - 111 mmol/L   CO2 28 22 - 32 mmol/L   Glucose, Bld 78 70 - 99 mg/dL    Comment: Glucose reference range applies only to samples taken after fasting for at least 8 hours.   BUN 17 6 - 20 mg/dL   Creatinine, Ser 0.55 0.44 - 1.00 mg/dL   Calcium 8.7 (L) 8.9 - 10.3 mg/dL   GFR, Estimated >60 >60 mL/min    Comment: (NOTE) Calculated using the CKD-EPI Creatinine Equation (2021)    Anion gap 5 5 - 15    Comment: Performed at [facility], [place] [address], [place], [place] [address]    Blood Alcohol level:  Lab Results  Component Value Date   [facility] <10 08/28/2021    Physical Findings: Musculoskeletal: Strength & Muscle Tone: within  normal limits Gait & Station: normal Patient leans: N/A  Psychiatric Specialty Exam: Physical Exam Vitals and nursing note reviewed.  Constitutional:      General: She is awake. She is not in acute distress.    Appearance: She is not ill-appearing or diaphoretic.  HENT:     Head: Normocephalic.  Pulmonary:     Effort: Pulmonary effort is normal. No respiratory distress.  Neurological:     General: No focal deficit present.     Mental Status: She is alert.  Psychiatric:        Behavior: Behavior is cooperative.    Review of Systems  Respiratory:  Negative for shortness of breath.   Cardiovascular:  Negative for chest pain.  Gastrointestinal:  Negative for nausea and vomiting.  Neurological:  Negative for dizziness and headaches.    Body mass index is 21.58 kg/m. Temp:  [97.8 F (36.6 C)-98.6 F (37 C)] 97.8 F (36.6 C) (02/05 0638) Pulse Rate:  [64-78] 74 (02/05 0640) BP: (111-126)/(61-69) 123/69 (02/05 0640) SpO2:  [100 %] 100 % (02/05 8366)  General Appearance:  Casually dressed, adequate hygiene    Eye Contact:   Good    Speech:   Clear and Coherent; Normal Rate    Volume:   Normal    Mood:   Mildly anxious    Affect:   Congruent - but overall brighter and less irritable    Thought Process:   Circumstantial     Orientation:   Full (Time, Place and Person)    Thought Content:   Ruminative on psychosocial stressors; denies SI, HI, AVH, paranoia, delusions, ideas of reference, or first rank symptoms   Suicidal Thoughts:  Denied    Homicidal Thoughts:   Denied    Memory:   Immediate Good; Recent Good; Remote Fair    Judgement:   Fair    Insight:   Improving    Psychomotor Activity:   Normal (No tremors or restlessness noted)    Concentration:   Good    Attention Span:   Good   Recall:   Good    Fund of Knowledge:   Good    Language:   Good    Handed:   Right    Assets:   Communication Skills; Desire for Improvement;  Financial Resources/Insurance; Housing; Intimacy; Social Support    ADL's:  Intact  Cognition:  WNL  Sleep:   Total time unrecorded      CIWA:  CIWA-Ar Total: 0     Treatment Assessment & Plan Summary: Lori Underwood is a 51 y.o. female with PPHx of MDD, GAD, PTSD and ADHD who presented to [facility] ED for active SI (thought to OD on pills) and who was then transferred to [facility] for continuous observation while awaiting an inpatient psychiatric bed. She was then admitted Voluntary to [facility] for active SI and treatment of MDD and GAD in the setting of dispute with her son and his IVC. She appears to be clinically improving with groups and structure of milieu as well as with start of medication.    PLAN: MDD recurrent severe without psychotic features PTSD by hx GAD per hx (r/o panic d/o) Cluster B traits Recommended trauma therapy and family therapy after discharge Continue Remeron 15 mg qHS for residual anxiety and depressive sx Continued BuSpar 7.5 mg BID for anxiety and irritability   R/o sedative/hypnotic anxiolytic use d/o R/o stimulant use d/o Patient symptoms per above. Patient understood that after discharge, she will have to follow up with an outpatient provider if she is to resume benzodiazepines after discharge. She was advised not to restart stimulants given her anxiety issues. Continued to monitor for signs of withdrawal and advised no scripts for controlled meds at discharge No stimulants ordered  Continued CIWA with Librium PRN per protocol (recent CIWA scores 0,0,0) Continued Librium taper per MAR (end 2/6)  Mild Hypokalemia - resolved Klor 59meq bid today and repeat BMP today shows K+ 3.8  Dispo: PCP referral and appointment for Dr. Bridgett Larsson at [facility] - see if she can start PHP/IOP Barrier: Librium taper, ending 2/6  Safety, monitoring and disposition planning: The patient was seen and evaluated on the unit.  The patient's chart was reviewed and  nursing notes were reviewed.  The patient's case was discussed in multidisciplinary team meeting.  Plan and drug side effects were discussed with patient who was amendable. Social work and case management to assist with discharge planning and identification of hospital follow-up needs prior to discharge. Discharge Concerns: Need to establish a safety plan; Medication compliance and effectiveness Discharge Goals: Return home with outpatient referrals for mental health follow-up including medication management/psychotherapy Safety and Monitoring: Voluntary status at inpatient psychiatric unit for safety, stabilization and treatment Daily contact with patient to assess and evaluate symptoms and progress in treatment and medical management Patient's case to be discussed in multi-disciplinary team meeting Observation Level: Per above Vital signs:  q12 hours Precautions: Per chart  Signed: Harlow Asa, MD, Alda Ponder

## 2021-09-03 NOTE — Group Note (Signed)
[facility] LCSW Group Therapy Note  09/03/2021    Type of Therapy and Topic:  Group Therapy:   Processing Significant Event and Introduction to Supporting Self  Participation Level:  Active   Description of Group:  Patients in this group were first given the opportunity to process an event that took place in the prior group during which a fellow group member had an epileptic seizure and other patients had to immediately leave the room, do not know what happened, and will not get news of the person's recovery.  CSW compared that event to having a psychiatric crisis and asked how patients and their loved ones would react the same or differently.  There was a healthy discussion about this.  We then talked about needs for support after discharge, including the fact that we have to (1) know our own diagnosis, (2) be able to describe the symptoms of their illness, (3) be willing to figure out a way to share this with important people in our lives.  A song entitled "My Own Hero" was played which patients said inspired them to realize they must help themselves first and foremost.  WWe also talked about how to put up necessary boundaries to limit unhealthy supports from harming [place].  Therapeutic Goals: 1)  provide support about a difficult event that patients witnessed when another patient had a seizure during group just prior to this group 2)  discuss the differences and similarities between physical breakdowns and mental breakdowns  3)  identify the patient's current level of self-support  4)  elicit thoughtfulness about how to garner additional support from loves ones by providing them education about mental health diagnoses   Summary of Patient Progress:  The patient expressed that one good quality about herself is that she is creative.  Her reaction to the significant event that took place was helplessness.  Her thoughts about self-support were positive and constructive.  The quality of patient's participation  was interactive and listening.  Therapeutic Modalities:   Motivational Interviewing Processing  Lori Underwood

## 2021-09-04 MED ORDER — NICOTINE 14 MG/24HR TD PT24: 14.0000 mg | MEDICATED_PATCH | Freq: Every day | TRANSDERMAL | 0 refills | Status: AC

## 2021-09-04 MED ORDER — BUSPIRONE HCL 7.5 MG PO TABS: 7.5000 mg | ORAL_TABLET | Freq: Two times a day (BID) | ORAL | 0 refills | Status: AC

## 2021-09-04 MED ORDER — MIRTAZAPINE 15 MG PO TABS: 15.0000 mg | ORAL_TABLET | Freq: Every day | ORAL | 0 refills | Status: AC

## 2021-09-04 MED ORDER — ENSURE ENLIVE PO LIQD: 237.0000 mL | Freq: Two times a day (BID) | ORAL | 12 refills | Status: AC

## 2021-09-04 NOTE — Discharge Instructions (Signed)
Dear Lori Underwood, I am so glad you are feeling better and can be discharged today!  Please see below for doctor or provider within 1-2 WEEKS of leaving for a [place] and see below for any other follow-up appointments.  Additional Instructions: Diagnosis:  Principal Problem:   MDD (major depressive disorder), recurrent severe, without psychosis ([place]) Active Problems:   PTSD (post-traumatic stress disorder)   GAD (generalized anxiety disorder)    If you are experiencing side effects, please DO NOT STOP your meds, rather see your psychiatrist first to change it.  Meds: CONTINUE: Remeron 15 mg every night BuSpar 7.5 mg 2 times every day STOP: Home xanax because benzodiazepines poorly helps with anxiety in the long term and is addictive and risk of withdrawal and possibly seizures.  Home adderall because it will make your anxiety and sleep worst Outpatient follow-up PCP (primary care provider) Please see them to have your potassium rechecked and to see if you need further supplementation. It was normalized after supplementation Please see them for any further baseline labs to monitor Therapy:  Please CALL the resources provided for therapy to make an appointment Recommend that you continue therapy for about 5 years Examples of therapy to consider: Cognitive behavioral therapy (CBT) Dialectical behavior therapy (DBT) Trauma therapy Motivational enhancement therapy Solution-focused brief therapy Psychodynamic therapy Interpersonal therapy Group therapy AA EMERGENCY: National Suicide Hotline: Call [address] and Text for Suicide: Brewing technologist and Text 911 for ED [facility] Address: [address], [place], [place] [address] Please see your discharge packet for other resources  It was a pleasure meeting you, Lori Underwood. I wish you the best, and hope you stay happy and healthy!  Merrily Brittle, DO 09/04/2021

## 2021-09-04 NOTE — Progress Notes (Signed)
Nursing discharge note: Patient discharged home per MD order.  Patient received all personal belongings from unit and locker.  Reviewed AVS/transition record with patient and she indicates understanding.  Patient will follow up with Dr. Donata Duff in [facility].  Patient denies any thoughts of self harm. She left ambulatory with her boyfriend.   09/04/21 0900  Psych Admission Type (Psych Patients Only)  Admission Status Voluntary  Psychosocial Assessment  Patient Complaints None  Eye Contact Fair  Facial Expression Animated  Affect Appropriate to circumstance  Speech Logical/coherent  Interaction Assertive  Motor Activity Other (Comment) (wnl)  Appearance/Hygiene Unremarkable  Behavior Characteristics Cooperative  Mood Pleasant  Thought Process  Coherency WDL  Content WDL  Delusions None reported or observed  Perception WDL  Hallucination None reported or observed  Judgment Impaired  Confusion None  Danger to Self  Current suicidal ideation? Denies  Self-Injurious Behavior No self-injurious ideation or behavior indicators observed or expressed   Agreement Not to Harm Self Yes  Description of Agreement verbal  Danger to Others  Danger to Others None reported or observed

## 2021-09-04 NOTE — Progress Notes (Signed)
°  [facility] Adult Case Management Discharge Plan :  Will you be returning to the same living situation after discharge:  No. Boyfriend's Home  At discharge, do you have transportation home?: Yes,  Boyfriend  Do you have the ability to pay for your medications: Yes,  Insurance   Release of information consent forms completed and in the chart;  Patient's signature needed at discharge.  Patient to Follow up at:  Follow-up Information     Gwendalyn Ege, MD. Go on 09/18/2021.   Specialty: Behavioral Health Why: You have an appointment on 09/18/21 at 1:00 pm for medication management services.  This appointment will be held in person. Please arrive 15 minutes prior to your appointment.  It is very important that you attend this appointment. Contact information: [place] [address] [phone]         Brock Ra H, PA-C Follow up.   Contact information: [address] [place] [address] [phone]         [facility] [place]. Call.   Specialty: Behavioral Health Why: A referral has been made to this provider for therapy and/or medication management services.  Please call to schedule an appointment and pay any necessary initial be to become established., opt. 4 - Chief Strategy Officer information: [address] [place] [phone] [phone]         [facility] [place]. Call.   Why: A referral has been made to this provider for the partial hospitalization program for intensive therapy services.  This program is held in person.  Please call if you wish to schedule an appointment for PHP. Contact information: [place] [place], [place] [address]    Phone: [phone]  Fax: [phone]                Next level of care provider has access to [place] and Suicide Prevention discussed: Yes,  with patient and boyfriend       Has patient been referred to the Quitline?: Patient refused referral  Patient has been referred for addiction treatment: [place], [place] 09/04/2021, 9:40 AM

## 2021-09-04 NOTE — Hospital Course (Signed)
HOSPITAL COURSE: Lori Underwood is a 51 y.o. female with reported PPHx of MDD, GAD, PTSD and ADHD who presented to [facility] ED for active SI and who was then transferred to [facility] for continuous observation while awaiting an inpatient psychiatric bed. She was then admitted Voluntary to [facility] for active SI and treatment of MDD and GAD.   During the patient's hospitalization, patient had extensive initial psychiatric evaluation, and follow-up psychiatric evaluations every day.  Psychiatric diagnoses provided upon initial assessment:  Principal Problem:   MDD (major depressive disorder), recurrent severe, without psychosis ([place]) Active Problems:   PTSD (post-traumatic stress disorder)   GAD (generalized anxiety disorder)   Patient's psychiatric medications were adjusted on admission:  Discontinued home xanax 1 mg Discontinued home adderall 10 mg daily Started Remeron 7.5 mg nightly for MDD, GAD, and sleep aid Started Librium taper for benzodiazepine withdraw  During the hospitalization, other adjustments were made to the patient's psychiatric medication regimen:  Increased Remeron 7.5 mg to 15 mg nightly for MDD, GAD, and sleep aid Started BuSpar 7.5 mg 2 times daily for anxiety  Considered (but did not start): SSRI/SNRI - patient reported trialing multiple in the past and believed that it would not be useful Gabapentin - patient reported having nightmares when on it last time during chemo treatment

## 2021-09-04 NOTE — BHH Group Notes (Signed)
Adult Psychoeducational Group Note  Date:  09/04/2021 Time:  10:45 AM  Group Topic/Focus:  Goals Group:   The focus of this group is to help patients establish daily goals to achieve during treatment and discuss how the patient can incorporate goal setting into their daily lives to aide in recovery.  Participation Level:  Active  Participation Quality:  Appropriate  Affect:  Appropriate  Cognitive:  Appropriate  Insight: Appropriate  Engagement in Group:  Engaged  Modes of Intervention:  Discussion  Additional Comments:  Patient attended morning orientation/goal group and participated.   Lori Underwood W [name] 03/02/6961, 10:45 AM

## 2021-09-04 NOTE — Group Note (Signed)
Recreation Therapy Group Note   Group Topic:Stress Management  Group Date: 09/04/2021 Start Time: 0930 End Time: 0950 Facilitators: Victorino Sparrow, LRT,CTRS Location: [address] Dayroom   Goal Area(s) Addresses:  Patient will actively participate in stress management techniques presented during session.  Patient will successfully identify benefit of practicing stress management post d/c.   Group Description: Guided Imagery. LRT provided education, instruction, and demonstration on practice of visualization via guided imagery. Patient was asked to participate in the technique introduced during session. LRT debriefed including topics of mindfulness, stress management and specific scenarios each patient could use these techniques. Patients were given suggestions of ways to access scripts post d/c and encouraged to explore Youtube and other apps available on smartphones, tablets, and computers.   Affect/Mood: Appropriate   Participation Level: Active   Participation Quality: Independent   Behavior: Appropriate   Speech/Thought Process: Focused   Insight: Good   Judgement: Good   Modes of Intervention: Script, Ambient Sounds   Patient Response to Interventions:  Attentive   Education Outcome:  Acknowledges education and In group clarification offered    Clinical Observations/Individualized Feedback: Pt attended and participated in group activity.    Plan: Continue to engage patient in RT group sessions 2-3x/week.   Victorino Sparrow, LRT,CTRS 09/04/2021 11:09 AM
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
## 2021-09-04 NOTE — [facility] Suicide Risk Assessment (Signed)
[facility] Discharge Suicide Risk Assessment  Principal Problem: MDD (major depressive disorder), recurrent severe, without psychosis ([place]) Discharge Diagnoses:  Principal Problem:   MDD (major depressive disorder), recurrent severe, without psychosis ([place]) Active Problems:   PTSD (post-traumatic stress disorder)   GAD (generalized anxiety disorder)   Total Time spent with patient:  I personally spent 35 minutes on the unit in direct patient care. The direct patient care time included face-to-face time with the patient, reviewing the patient's chart, communicating with other professionals, and coordinating care. Greater than 50% of this time was spent in counseling or coordinating care with the patient regarding goals of hospitalization, psycho-education, and discharge planning needs.   Subjective: Patient seen today on rounds. She denies SI, HI, AVH, paranoia or delusions.  She reports stable sleep and appetite. She has had no side effects to current medications and wishes to continue present dose.  She was encouraged to continue monitoring for symptom improvement and to follow-up with outpatient resources that has been set up (see below). Discussed the risk of home xanax and adderall making her symptoms worst and recommended to discontinue those meds and that she will not be provided prescriptions for xanax and adderall from the hospital. However if she wishes to continue the scheduled meds that she would have to discuss that with her outpatient provider.   She voices no physical complaints and time was given for questions.   She was again encouraged to abstain from alcohol and illicit substance use after discharge.  Musculoskeletal: Strength & Muscle Tone: within normal limits Gait & Station: normal Patient leans: N/A  Psychiatric Specialty Exam Presentation  General Appearance: Appropriate for Environment; Casual; Fairly Groomed   Eye Contact:Good   Speech:Clear and Coherent; Normal Rate  (Rambling at times)   Speech Volume:Normal   Handedness:Right    Mood and Affect  Mood:Anxious; Euthymic (Described "stable and hopeful" however still has some baseline anxiety that has improved)    Affect:Appropriate; Congruent; Full Range    Thought Process  Thought Processes:Coherent   Descriptions of Associations:Circumstantial   Orientation:Full (Time, Place and Person)   Thought Content:Logical; WDL (Future oriented)   Hallucinations:Hallucinations: None   Ideas of Reference:None    Suicidal Thoughts:Suicidal Thoughts: No SI Active Intent and/or Plan: Without Intent; Without Plan SI Passive Intent and/or Plan: Without Intent; Without Plan   Homicidal Thoughts:Homicidal Thoughts: No    Sensorium  Memory:Immediate Good; Recent Good; Remote Fair   Judgment:Good   Insight:Fair    Executive Functions  Concentration:Good   Attention Span:Good   [place] of Knowledge:Good   Language:Good   Psychomotor Activity  Psychomotor Activity:Psychomotor Activity: Normal   Assets  Assets:Communication Skills; Desire for Improvement; Housing; Intimacy; Leisure Time; Social Support; Transportation    Physical Exam: Body mass index is 21.58 kg/m. Blood pressure 111/76, pulse 73, temperature 98 F (36.7 C), temperature source Oral, resp. rate 18, height 5\' 2"  (1.575 m), weight 53.5 kg, SpO2 100 %.   Physical Exam Vitals and nursing note reviewed.  Constitutional:      General: She is awake. She is not in acute distress.    Appearance: She is not ill-appearing or diaphoretic.  HENT:     Head: Normocephalic.  Pulmonary:     Effort: Pulmonary effort is normal. No respiratory distress.  Neurological:     General: No focal deficit present.     Mental Status: She is alert.  Psychiatric:        Behavior: Behavior is cooperative.  Review of Systems  Respiratory:  Negative for shortness of breath.   Cardiovascular:  Negative  for chest pain.  Gastrointestinal:  Negative for nausea and vomiting.  Neurological:  Negative for dizziness and headaches.   Mental Status Per Nursing Assessment::   On Admission:  SI - resolved  Demographic Factors:  Caucasian, Adolescent or young adult  Loss Factors: Loss of significant relationship (ended a 5 year relationship when she went to college); death of father by suicide  Historical Factors: Family history of mental illness or substance abuse (depression and anxiety on both sides of family; father bipolar)  Risk Reduction Factors:   Positive social support, Living with another person, especially a relative, Positive therapeutic relationship  Continued Clinical Symptoms:  Alcohol/Substance Abuse/Dependencies  Cognitive Features That Contribute To Risk:  None    Suicide Risk:  Mild: There are no identifiable plans, no associated intent, mild dysphoria and related symptoms, good self-control on the unit, and some other risk factors (completed suicide in first degree relative), and identifiable protective factors, including available and accessible social support.   Plan Of Care/Follow-up recommendations:  Follow-up with your outpatient psychiatric provider -instructions on appointment date, time, and address (location) are provided to you in discharge paperwork. Take your psychiatric medications as prescribed at discharge - instructions are provided to you in the discharge paperwork Recommend abstinence from alcohol, tobacco, and other illicit drug use at discharge.  If your psychiatric symptoms recur, worsen, or if you have side effects to your psychiatric medications, call your outpatient psychiatric provider, 911, [phone] or go to the nearest emergency department. If suicidal thoughts recur, call your outpatient psychiatric provider, 911, [phone] or go to the nearest emergency department.    Comments: Patient advised to comply with medications below If you are experiencing  side effects, please DO NOT STOP your meds, rather see your psychiatrist first to change it.  Meds: CONTINUE: Remeron 15 mg every night BuSpar 7.5 mg 2 times every day STOP: Home xanax because benzodiazepines poorly helps with anxiety in the long term and is addictive and risk of withdrawal and possibly seizures.  Home adderall because it will make your anxiety and sleep worst Outpatient follow-up PCP (primary care provider) Please see them to have your potassium rechecked and to see if you need further supplementation. It was normalized after supplementation Please see them for any further baseline labs to monitor Psychiatrist Management of psych meds  Signed: Merrily Brittle, DO Psychiatry Resident, PGY-1 [facility] 09/04/2021, 9:29 AM

## 2021-09-11 NOTE — [facility] Group Notes (Signed)
Spiritual care group on grief and loss facilitated by chaplain Janne Napoleon, [facility]   Group Goal:   Support / Education around grief and loss   Members engage in facilitated group support and psycho-social education.   Group Description:   Following introductions and group rules, group members engaged in facilitated group dialog and support around topic of loss, with particular support around experiences of loss in their lives. Group Identified types of loss (relationships / self / things) and identified patterns, circumstances, and changes that precipitate losses. Reflected on thoughts / feelings around loss, normalized grief responses, and recognized variety in grief experience. Group noted Worden's four tasks of grief in discussion.   Group drew on Adlerian / Rogerian, narrative, MI,   Patient Progress: Lori Underwood participated in group and shared about some losses she has experienced related to her own health.  [employer], [place] Pager, [phone] 2:23 PM
